# Patient Record
Sex: Female | Born: 1960 | Race: White | Hispanic: No | Marital: Single
Health system: Southern US, Community
[De-identification: ages and names within clinical notes are randomized; demographics above are authoritative.]

---

## 2013-06-21 LAB — COMPREHENSIVE METABOLIC PANEL
BUN: 13 mg/dL (ref 7–18)
Bilirubin,Total: 0.4 mg/dL (ref 0.2–1.0)
Calcium, Total: 8.6 mg/dL (ref 8.5–10.1)
Co2: 28 mmol/L (ref 21–32)
Glucose: 108 mg/dL — ABNORMAL HIGH (ref 65–99)
Osmolality: 276 (ref 275–301)
Potassium: 3.9 mmol/L (ref 3.5–5.1)
SGOT(AST): 37 U/L (ref 15–37)
SGPT (ALT): 29 U/L (ref 12–78)
Sodium: 138 mmol/L (ref 136–145)
Total Protein: 7.1 g/dL (ref 6.4–8.2)

## 2013-06-21 LAB — CBC
MCH: 28.8 pg (ref 26.0–34.0)
MCHC: 33.8 g/dL (ref 32.0–36.0)
RBC: 4.1 10*6/uL (ref 3.80–5.20)
RDW: 14.6 % — ABNORMAL HIGH (ref 11.5–14.5)

## 2013-06-21 LAB — CK TOTAL AND CKMB (NOT AT ARMC)
CK, Total: 113 U/L (ref 21–215)
CK-MB: <0.5 ng/mL — ABNORMAL LOW (ref 0.5–3.6)

## 2013-06-23 ENCOUNTER — Inpatient Hospital Stay: Payer: Self-pay | Admitting: Family Medicine

## 2013-06-25 LAB — CBC WITH DIFFERENTIAL/PLATELET
Basophil %: 0.1 %
Eosinophil #: 0 10*3/uL (ref 0.0–0.7)
Eosinophil %: 0.1 %
HGB: 10.9 g/dL — ABNORMAL LOW (ref 12.0–16.0)
Lymphocyte #: 0.7 10*3/uL — ABNORMAL LOW (ref 1.0–3.6)
Lymphocyte %: 8.6 %
MCH: 28.7 pg (ref 26.0–34.0)
MCHC: 33.8 g/dL (ref 32.0–36.0)
Monocyte #: 0.3 x10 3/mm (ref 0.2–0.9)
Monocyte %: 4.1 %
Neutrophil #: 6.7 10*3/uL — ABNORMAL HIGH (ref 1.4–6.5)
Neutrophil %: 87.1 %
Platelet: 149 10*3/uL — ABNORMAL LOW (ref 150–440)
RBC: 3.79 10*6/uL — ABNORMAL LOW (ref 3.80–5.20)
RDW: 14.9 % — ABNORMAL HIGH (ref 11.5–14.5)
WBC: 7.7 10*3/uL (ref 3.6–11.0)

## 2013-06-25 LAB — BASIC METABOLIC PANEL
Anion Gap: 5 — ABNORMAL LOW (ref 7–16)
BUN: 16 mg/dL (ref 7–18)
Chloride: 104 mmol/L (ref 98–107)
Co2: 32 mmol/L (ref 21–32)
Creatinine: 0.99 mg/dL (ref 0.60–1.30)
EGFR (African American): >60
Glucose: 135 mg/dL — ABNORMAL HIGH (ref 65–99)
Sodium: 141 mmol/L (ref 136–145)

## 2013-06-26 LAB — CULTURE, BLOOD (SINGLE)

## 2013-08-27 LAB — BASIC METABOLIC PANEL
Anion Gap: 5 — ABNORMAL LOW (ref 7–16)
Calcium, Total: 9.1 mg/dL (ref 8.5–10.1)
Creatinine: 0.95 mg/dL (ref 0.60–1.30)
EGFR (African American): >60
EGFR (Non-African Amer.): >60
Glucose: 111 mg/dL — ABNORMAL HIGH (ref 65–99)
Osmolality: 273 (ref 275–301)
Potassium: 3.8 mmol/L (ref 3.5–5.1)
Sodium: 136 mmol/L (ref 136–145)

## 2013-08-27 LAB — CBC
MCH: 28.7 pg (ref 26.0–34.0)
MCHC: 33.6 g/dL (ref 32.0–36.0)
MCV: 85 fL (ref 80–100)
Platelet: 147 10*3/uL — ABNORMAL LOW (ref 150–440)
WBC: 8.1 10*3/uL (ref 3.6–11.0)

## 2013-08-27 LAB — TROPONIN I: Troponin-I: <0.02 ng/mL

## 2013-08-28 ENCOUNTER — Inpatient Hospital Stay: Payer: Self-pay | Admitting: Internal Medicine

## 2013-08-28 LAB — RAPID INFLUENZA A&B ANTIGENS

## 2013-08-29 LAB — BASIC METABOLIC PANEL
Chloride: 108 mmol/L — ABNORMAL HIGH (ref 98–107)
Co2: 26 mmol/L (ref 21–32)
EGFR (African American): >60
Glucose: 165 mg/dL — ABNORMAL HIGH (ref 65–99)

## 2013-08-29 LAB — MAGNESIUM: Magnesium: 1.7 mg/dL — ABNORMAL LOW

## 2013-08-30 LAB — EXPECTORATED SPUTUM ASSESSMENT W REFEX TO RESP CULTURE

## 2013-08-31 LAB — PRO B NATRIURETIC PEPTIDE: B-Type Natriuretic Peptide: 1494 pg/mL — ABNORMAL HIGH (ref 0–125)

## 2013-08-31 LAB — SEDIMENTATION RATE: Erythrocyte Sed Rate: 43 mm/hr — ABNORMAL HIGH (ref 0–30)

## 2013-09-04 LAB — BRONCHIAL WASH CULTURE

## 2013-12-22 ENCOUNTER — Emergency Department: Payer: Self-pay | Admitting: Emergency Medicine

## 2013-12-22 LAB — CBC
HCT: 41.1 % (ref 35.0–47.0)
HGB: 12.9 g/dL (ref 12.0–16.0)
MCH: 27.6 pg (ref 26.0–34.0)
MCHC: 31.3 g/dL — AB (ref 32.0–36.0)
MCV: 88 fL (ref 80–100)
PLATELETS: 193 10*3/uL (ref 150–440)
RBC: 4.66 10*6/uL (ref 3.80–5.20)
RDW: 14.1 % (ref 11.5–14.5)
WBC: 10.3 10*3/uL (ref 3.6–11.0)

## 2013-12-22 LAB — BASIC METABOLIC PANEL
Anion Gap: 5 — ABNORMAL LOW (ref 7–16)
BUN: 13 mg/dL (ref 7–18)
CALCIUM: 9 mg/dL (ref 8.5–10.1)
CO2: 29 mmol/L (ref 21–32)
Chloride: 101 mmol/L (ref 98–107)
Creatinine: 1.01 mg/dL (ref 0.60–1.30)
EGFR (African American): >60
EGFR (Non-African Amer.): >60
Glucose: 94 mg/dL (ref 65–99)
Osmolality: 270 (ref 275–301)
Potassium: 3.9 mmol/L (ref 3.5–5.1)
Sodium: 135 mmol/L — ABNORMAL LOW (ref 136–145)

## 2013-12-22 LAB — TROPONIN I: Troponin-I: <0.02 ng/mL

## 2013-12-22 LAB — CK TOTAL AND CKMB (NOT AT ARMC)
CK, Total: 75 U/L
CK-MB: <0.5 ng/mL — ABNORMAL LOW (ref 0.5–3.6)

## 2013-12-22 LAB — PRO B NATRIURETIC PEPTIDE: B-TYPE NATIURETIC PEPTID: 37 pg/mL (ref 0–125)

## 2013-12-27 LAB — CULTURE, BLOOD (SINGLE)

## 2014-02-12 ENCOUNTER — Ambulatory Visit: Payer: Self-pay | Admitting: Specialist

## 2014-02-14 LAB — CBC WITH DIFFERENTIAL/PLATELET
Basophil #: 0 10*3/uL (ref 0.0–0.1)
Basophil %: 0.4 %
Eosinophil #: 0.2 10*3/uL (ref 0.0–0.7)
Eosinophil %: 2 %
HCT: 36.7 % (ref 35.0–47.0)
HGB: 11.8 g/dL — ABNORMAL LOW (ref 12.0–16.0)
Lymphocyte #: 2.6 10*3/uL (ref 1.0–3.6)
Lymphocyte %: 34.4 %
MCH: 27.8 pg (ref 26.0–34.0)
MCHC: 32.1 g/dL (ref 32.0–36.0)
MCV: 87 fL (ref 80–100)
Monocyte #: 0.6 x10 3/mm (ref 0.2–0.9)
Monocyte %: 8.6 %
Neutrophil #: 4.1 10*3/uL (ref 1.4–6.5)
Neutrophil %: 54.6 %
Platelet: 142 10*3/uL — ABNORMAL LOW (ref 150–440)
RBC: 4.23 10*6/uL (ref 3.80–5.20)
RDW: 14.8 % — ABNORMAL HIGH (ref 11.5–14.5)
WBC: 7.5 10*3/uL (ref 3.6–11.0)

## 2014-02-14 LAB — COMPREHENSIVE METABOLIC PANEL
ALBUMIN: 3.8 g/dL (ref 3.4–5.0)
ANION GAP: 7 (ref 7–16)
Alkaline Phosphatase: 68 U/L
BUN: 7 mg/dL (ref 7–18)
Bilirubin,Total: 0.4 mg/dL (ref 0.2–1.0)
CALCIUM: 8.9 mg/dL (ref 8.5–10.1)
CREATININE: 0.92 mg/dL (ref 0.60–1.30)
Chloride: 102 mmol/L (ref 98–107)
Co2: 28 mmol/L (ref 21–32)
EGFR (African American): >60
EGFR (Non-African Amer.): >60
Glucose: 106 mg/dL — ABNORMAL HIGH (ref 65–99)
OSMOLALITY: 272 (ref 275–301)
Potassium: 3.7 mmol/L (ref 3.5–5.1)
SGOT(AST): 31 U/L (ref 15–37)
SGPT (ALT): 23 U/L (ref 12–78)
Sodium: 137 mmol/L (ref 136–145)
TOTAL PROTEIN: 7.5 g/dL (ref 6.4–8.2)

## 2014-02-14 LAB — CK TOTAL AND CKMB (NOT AT ARMC)
CK, Total: 209 U/L — ABNORMAL HIGH
CK-MB: 0.8 ng/mL (ref 0.5–3.6)

## 2014-02-14 LAB — TROPONIN I: Troponin-I: <0.02 ng/mL

## 2014-02-15 ENCOUNTER — Observation Stay: Payer: Self-pay | Admitting: Family Medicine

## 2014-02-15 LAB — URINALYSIS, COMPLETE
Bacteria: NONE SEEN
Bilirubin,UR: NEGATIVE
Glucose,UR: NEGATIVE mg/dL (ref 0–75)
Ketone: NEGATIVE
Leukocyte Esterase: NEGATIVE
NITRITE: NEGATIVE
Ph: 5 (ref 4.5–8.0)
Protein: NEGATIVE
RBC,UR: 1 /HPF (ref 0–5)
Specific Gravity: 1.004 (ref 1.003–1.030)
Squamous Epithelial: NONE SEEN
WBC UR: <1 /HPF (ref 0–5)

## 2014-02-16 LAB — BASIC METABOLIC PANEL
ANION GAP: 5 — AB (ref 7–16)
BUN: 11 mg/dL (ref 7–18)
Calcium, Total: 9.4 mg/dL (ref 8.5–10.1)
Chloride: 103 mmol/L (ref 98–107)
Co2: 29 mmol/L (ref 21–32)
Creatinine: 0.86 mg/dL (ref 0.60–1.30)
EGFR (African American): >60
EGFR (Non-African Amer.): >60
GLUCOSE: 114 mg/dL — AB (ref 65–99)
OSMOLALITY: 274 (ref 275–301)
Potassium: 4.4 mmol/L (ref 3.5–5.1)
Sodium: 137 mmol/L (ref 136–145)

## 2014-03-04 ENCOUNTER — Emergency Department: Payer: Self-pay | Admitting: Emergency Medicine

## 2014-03-04 LAB — CBC
HCT: 37.9 % (ref 35.0–47.0)
HGB: 12 g/dL (ref 12.0–16.0)
MCH: 27.9 pg (ref 26.0–34.0)
MCHC: 31.7 g/dL — ABNORMAL LOW (ref 32.0–36.0)
MCV: 88 fL (ref 80–100)
Platelet: 183 10*3/uL (ref 150–440)
RBC: 4.31 10*6/uL (ref 3.80–5.20)
RDW: 14.9 % — ABNORMAL HIGH (ref 11.5–14.5)
WBC: 9.2 10*3/uL (ref 3.6–11.0)

## 2014-03-04 LAB — COMPREHENSIVE METABOLIC PANEL
Albumin: 3.6 g/dL (ref 3.4–5.0)
Alkaline Phosphatase: 60 U/L
Anion Gap: 11 (ref 7–16)
BILIRUBIN TOTAL: 0.5 mg/dL (ref 0.2–1.0)
BUN: 8 mg/dL (ref 7–18)
CHLORIDE: 98 mmol/L (ref 98–107)
Calcium, Total: 9.4 mg/dL (ref 8.5–10.1)
Co2: 29 mmol/L (ref 21–32)
Creatinine: 1 mg/dL (ref 0.60–1.30)
EGFR (Non-African Amer.): >60
Glucose: 105 mg/dL — ABNORMAL HIGH (ref 65–99)
OSMOLALITY: 274 (ref 275–301)
Potassium: 4 mmol/L (ref 3.5–5.1)
SGOT(AST): 28 U/L (ref 15–37)
SGPT (ALT): 30 U/L (ref 12–78)
Sodium: 138 mmol/L (ref 136–145)
Total Protein: 8.2 g/dL (ref 6.4–8.2)

## 2014-03-04 LAB — URINALYSIS, COMPLETE
BILIRUBIN, UR: NEGATIVE
Bacteria: NONE SEEN
Glucose,UR: NEGATIVE mg/dL (ref 0–75)
Hyaline Cast: 15
KETONE: NEGATIVE
Leukocyte Esterase: NEGATIVE
Nitrite: NEGATIVE
PROTEIN: NEGATIVE
Ph: 5 (ref 4.5–8.0)
RBC,UR: 3 /HPF (ref 0–5)
SQUAMOUS EPITHELIAL: NONE SEEN
Specific Gravity: 1.018 (ref 1.003–1.030)
WBC UR: 1 /HPF (ref 0–5)

## 2014-03-04 LAB — CK TOTAL AND CKMB (NOT AT ARMC): CK, Total: 124 U/L

## 2014-03-04 LAB — TROPONIN I: Troponin-I: <0.02 ng/mL

## 2014-03-08 ENCOUNTER — Emergency Department: Payer: Self-pay | Admitting: Emergency Medicine

## 2014-03-08 LAB — CBC
HCT: 32 % — AB (ref 35.0–47.0)
HGB: 10.3 g/dL — AB (ref 12.0–16.0)
MCH: 28.1 pg (ref 26.0–34.0)
MCHC: 32.4 g/dL (ref 32.0–36.0)
MCV: 87 fL (ref 80–100)
Platelet: 182 10*3/uL (ref 150–440)
RBC: 3.68 10*6/uL — ABNORMAL LOW (ref 3.80–5.20)
RDW: 14.7 % — AB (ref 11.5–14.5)
WBC: 5.4 10*3/uL (ref 3.6–11.0)

## 2014-03-08 LAB — BASIC METABOLIC PANEL
Anion Gap: 4 — ABNORMAL LOW (ref 7–16)
BUN: 12 mg/dL (ref 7–18)
CALCIUM: 9.3 mg/dL (ref 8.5–10.1)
CHLORIDE: 99 mmol/L (ref 98–107)
Co2: 35 mmol/L — ABNORMAL HIGH (ref 21–32)
Creatinine: 0.8 mg/dL (ref 0.60–1.30)
EGFR (Non-African Amer.): >60
Glucose: 91 mg/dL (ref 65–99)
OSMOLALITY: 275 (ref 275–301)
Potassium: 3.8 mmol/L (ref 3.5–5.1)
SODIUM: 138 mmol/L (ref 136–145)

## 2014-03-08 LAB — TROPONIN I

## 2014-03-09 LAB — CULTURE, BLOOD (SINGLE)

## 2014-03-16 ENCOUNTER — Ambulatory Visit: Payer: Self-pay | Admitting: Emergency Medicine

## 2014-03-16 LAB — CBC WITH DIFFERENTIAL/PLATELET
Basophil #: 0 10*3/uL (ref 0.0–0.1)
Basophil %: 0.4 %
Eosinophil #: 0.2 10*3/uL (ref 0.0–0.7)
Eosinophil %: 3.9 %
HCT: 31.8 % — ABNORMAL LOW (ref 35.0–47.0)
HGB: 10.4 g/dL — ABNORMAL LOW (ref 12.0–16.0)
Lymphocyte #: 1.1 10*3/uL (ref 1.0–3.6)
Lymphocyte %: 17.4 %
MCH: 28.1 pg (ref 26.0–34.0)
MCHC: 32.6 g/dL (ref 32.0–36.0)
MCV: 86 fL (ref 80–100)
Monocyte #: 0.5 x10 3/mm (ref 0.2–0.9)
Monocyte %: 7.9 %
Neutrophil #: 4.4 10*3/uL (ref 1.4–6.5)
Neutrophil %: 70.4 %
Platelet: 220 10*3/uL (ref 150–440)
RBC: 3.69 10*6/uL — ABNORMAL LOW (ref 3.80–5.20)
RDW: 15 % — ABNORMAL HIGH (ref 11.5–14.5)
WBC: 6.3 10*3/uL (ref 3.6–11.0)

## 2014-04-15 ENCOUNTER — Inpatient Hospital Stay: Payer: Self-pay | Admitting: Internal Medicine

## 2014-04-15 LAB — CBC WITH DIFFERENTIAL/PLATELET
Basophil #: 0.1 10*3/uL (ref 0.0–0.1)
Basophil %: 1.2 %
Eosinophil #: 0.4 10*3/uL (ref 0.0–0.7)
Eosinophil %: 8.3 %
HCT: 33.1 % — ABNORMAL LOW (ref 35.0–47.0)
HGB: 10.8 g/dL — ABNORMAL LOW (ref 12.0–16.0)
Lymphocyte #: 1.1 10*3/uL (ref 1.0–3.6)
Lymphocyte %: 20.8 %
MCH: 28.9 pg (ref 26.0–34.0)
MCHC: 32.5 g/dL (ref 32.0–36.0)
MCV: 89 fL (ref 80–100)
Monocyte #: 0.5 x10 3/mm (ref 0.2–0.9)
Monocyte %: 8.8 %
Neutrophil #: 3.2 10*3/uL (ref 1.4–6.5)
Neutrophil %: 60.9 %
Platelet: 191 10*3/uL (ref 150–440)
RBC: 3.73 10*6/uL — ABNORMAL LOW (ref 3.80–5.20)
RDW: 15.5 % — ABNORMAL HIGH (ref 11.5–14.5)
WBC: 5.2 10*3/uL (ref 3.6–11.0)

## 2014-04-15 LAB — PRO B NATRIURETIC PEPTIDE: B-Type Natriuretic Peptide: 49 pg/mL (ref 0–125)

## 2014-04-15 LAB — COMPREHENSIVE METABOLIC PANEL
ANION GAP: 9 (ref 7–16)
Albumin: 3.1 g/dL — ABNORMAL LOW (ref 3.4–5.0)
Alkaline Phosphatase: 55 U/L
BUN: 8 mg/dL (ref 7–18)
Bilirubin,Total: 0.3 mg/dL (ref 0.2–1.0)
Calcium, Total: 8.9 mg/dL (ref 8.5–10.1)
Chloride: 101 mmol/L (ref 98–107)
Co2: 28 mmol/L (ref 21–32)
Creatinine: 1.24 mg/dL (ref 0.60–1.30)
EGFR (African American): 57 — ABNORMAL LOW
EGFR (Non-African Amer.): 49 — ABNORMAL LOW
Glucose: 120 mg/dL — ABNORMAL HIGH (ref 65–99)
OSMOLALITY: 275 (ref 275–301)
Potassium: 3.9 mmol/L (ref 3.5–5.1)
SGOT(AST): 31 U/L (ref 15–37)
SGPT (ALT): 21 U/L (ref 12–78)
SODIUM: 138 mmol/L (ref 136–145)
TOTAL PROTEIN: 7.7 g/dL (ref 6.4–8.2)

## 2014-04-15 LAB — TROPONIN I: Troponin-I: <0.02 ng/mL

## 2014-04-15 LAB — TSH: Thyroid Stimulating Horm: 2.5 u[IU]/mL

## 2014-04-16 LAB — BASIC METABOLIC PANEL
ANION GAP: 8 (ref 7–16)
BUN: 9 mg/dL (ref 7–18)
CALCIUM: 8.4 mg/dL — AB (ref 8.5–10.1)
Chloride: 99 mmol/L (ref 98–107)
Co2: 28 mmol/L (ref 21–32)
Creatinine: 1.14 mg/dL (ref 0.60–1.30)
GFR CALC NON AF AMER: 54 — AB
GLUCOSE: 142 mg/dL — AB (ref 65–99)
OSMOLALITY: 271 (ref 275–301)
POTASSIUM: 4.1 mmol/L (ref 3.5–5.1)
SODIUM: 135 mmol/L — AB (ref 136–145)

## 2014-04-16 LAB — URINALYSIS, COMPLETE
BILIRUBIN, UR: NEGATIVE
Bacteria: NONE SEEN
Glucose,UR: NEGATIVE mg/dL (ref 0–75)
Ketone: NEGATIVE
Leukocyte Esterase: NEGATIVE
NITRITE: NEGATIVE
Ph: 5 (ref 4.5–8.0)
Protein: NEGATIVE
RBC,UR: 11 /HPF (ref 0–5)
Specific Gravity: 1.016 (ref 1.003–1.030)
Squamous Epithelial: <1
WBC UR: 1 /HPF (ref 0–5)

## 2014-04-16 LAB — CBC WITH DIFFERENTIAL/PLATELET
BASOS ABS: 0 10*3/uL (ref 0.0–0.1)
Basophil %: 0.4 %
Eosinophil #: 0.1 10*3/uL (ref 0.0–0.7)
Eosinophil %: 2.7 %
HCT: 29.6 % — AB (ref 35.0–47.0)
HGB: 9.6 g/dL — ABNORMAL LOW (ref 12.0–16.0)
LYMPHS ABS: 0.7 10*3/uL — AB (ref 1.0–3.6)
Lymphocyte %: 17.9 %
MCH: 28.7 pg (ref 26.0–34.0)
MCHC: 32.5 g/dL (ref 32.0–36.0)
MCV: 88 fL (ref 80–100)
MONOS PCT: 4.7 %
Monocyte #: 0.2 x10 3/mm (ref 0.2–0.9)
Neutrophil #: 2.9 10*3/uL (ref 1.4–6.5)
Neutrophil %: 74.3 %
Platelet: 165 10*3/uL (ref 150–440)
RBC: 3.36 10*6/uL — AB (ref 3.80–5.20)
RDW: 15.4 % — AB (ref 11.5–14.5)
WBC: 3.9 10*3/uL (ref 3.6–11.0)

## 2014-04-16 LAB — CK-MB
CK-MB: 0.7 ng/mL (ref 0.5–3.6)
CK-MB: <0.5 ng/mL — ABNORMAL LOW (ref 0.5–3.6)
CK-MB: <0.5 ng/mL — ABNORMAL LOW (ref 0.5–3.6)

## 2014-04-16 LAB — TROPONIN I
Troponin-I: <0.02 ng/mL
Troponin-I: <0.02 ng/mL

## 2014-04-16 LAB — MAGNESIUM: Magnesium: 1.9 mg/dL

## 2014-04-17 LAB — BASIC METABOLIC PANEL
Anion Gap: 7 (ref 7–16)
BUN: 9 mg/dL (ref 7–18)
CALCIUM: 8.8 mg/dL (ref 8.5–10.1)
CO2: 30 mmol/L (ref 21–32)
CREATININE: 0.85 mg/dL (ref 0.60–1.30)
Chloride: 102 mmol/L (ref 98–107)
EGFR (African American): >60
Glucose: 173 mg/dL — ABNORMAL HIGH (ref 65–99)
Osmolality: 280 (ref 275–301)
Potassium: 4.2 mmol/L (ref 3.5–5.1)
Sodium: 139 mmol/L (ref 136–145)

## 2014-04-18 LAB — BASIC METABOLIC PANEL
ANION GAP: 4 — AB (ref 7–16)
BUN: 11 mg/dL (ref 7–18)
CALCIUM: 9 mg/dL (ref 8.5–10.1)
CHLORIDE: 101 mmol/L (ref 98–107)
CREATININE: 1.01 mg/dL (ref 0.60–1.30)
Co2: 35 mmol/L — ABNORMAL HIGH (ref 21–32)
EGFR (African American): >60
EGFR (Non-African Amer.): >60
Glucose: 132 mg/dL — ABNORMAL HIGH (ref 65–99)
Osmolality: 281 (ref 275–301)
Potassium: 4.1 mmol/L (ref 3.5–5.1)
Sodium: 140 mmol/L (ref 136–145)

## 2014-04-18 LAB — CBC WITH DIFFERENTIAL/PLATELET
BASOS ABS: 0 10*3/uL (ref 0.0–0.1)
Basophil %: 0.1 %
Eosinophil #: 0 10*3/uL (ref 0.0–0.7)
Eosinophil %: 0.1 %
HCT: 30.1 % — ABNORMAL LOW (ref 35.0–47.0)
HGB: 9.5 g/dL — AB (ref 12.0–16.0)
LYMPHS ABS: 0.5 10*3/uL — AB (ref 1.0–3.6)
LYMPHS PCT: 8 %
MCH: 27.6 pg (ref 26.0–34.0)
MCHC: 31.6 g/dL — ABNORMAL LOW (ref 32.0–36.0)
MCV: 88 fL (ref 80–100)
Monocyte #: 0.3 x10 3/mm (ref 0.2–0.9)
Monocyte %: 4.2 %
NEUTROS PCT: 87.6 %
Neutrophil #: 5.7 10*3/uL (ref 1.4–6.5)
Platelet: 201 10*3/uL (ref 150–440)
RBC: 3.44 10*6/uL — AB (ref 3.80–5.20)
RDW: 15.6 % — ABNORMAL HIGH (ref 11.5–14.5)
WBC: 6.5 10*3/uL (ref 3.6–11.0)

## 2014-04-20 LAB — CULTURE, BLOOD (SINGLE)

## 2014-04-21 LAB — COMPREHENSIVE METABOLIC PANEL
ALT: 20 U/L (ref 12–78)
ANION GAP: 4 — AB (ref 7–16)
Albumin: 2.6 g/dL — ABNORMAL LOW (ref 3.4–5.0)
Alkaline Phosphatase: 39 U/L — ABNORMAL LOW
BILIRUBIN TOTAL: 0.2 mg/dL (ref 0.2–1.0)
BUN: 23 mg/dL — AB (ref 7–18)
CHLORIDE: 96 mmol/L — AB (ref 98–107)
CREATININE: 0.98 mg/dL (ref 0.60–1.30)
Calcium, Total: 8.7 mg/dL (ref 8.5–10.1)
Co2: 37 mmol/L — ABNORMAL HIGH (ref 21–32)
EGFR (African American): >60
EGFR (Non-African Amer.): >60
GLUCOSE: 150 mg/dL — AB (ref 65–99)
OSMOLALITY: 280 (ref 275–301)
POTASSIUM: 3.9 mmol/L (ref 3.5–5.1)
SGOT(AST): 18 U/L (ref 15–37)
SODIUM: 137 mmol/L (ref 136–145)
Total Protein: 6.6 g/dL (ref 6.4–8.2)

## 2014-04-21 LAB — PLATELET COUNT: Platelet: 212 10*3/uL (ref 150–440)

## 2014-05-10 LAB — EXPECTORATED SPUTUM ASSESSMENT W GRAM STAIN, RFLX TO RESP C

## 2014-08-16 ENCOUNTER — Emergency Department: Payer: Self-pay | Admitting: Emergency Medicine

## 2014-08-16 LAB — CBC
HCT: 32.7 % — ABNORMAL LOW (ref 35.0–47.0)
HGB: 10.7 g/dL — ABNORMAL LOW (ref 12.0–16.0)
MCH: 27.4 pg (ref 26.0–34.0)
MCHC: 32.7 g/dL (ref 32.0–36.0)
MCV: 84 fL (ref 80–100)
Platelet: 177 10*3/uL (ref 150–440)
RBC: 3.9 10*6/uL (ref 3.80–5.20)
RDW: 14.8 % — ABNORMAL HIGH (ref 11.5–14.5)
WBC: 6.7 10*3/uL (ref 3.6–11.0)

## 2014-08-16 LAB — COMPREHENSIVE METABOLIC PANEL
ALBUMIN: 3.1 g/dL — AB (ref 3.4–5.0)
AST: 33 U/L (ref 15–37)
Alkaline Phosphatase: 54 U/L
Anion Gap: 9 (ref 7–16)
BUN: 8 mg/dL (ref 7–18)
Bilirubin,Total: 0.3 mg/dL (ref 0.2–1.0)
CO2: 30 mmol/L (ref 21–32)
Calcium, Total: 8.2 mg/dL — ABNORMAL LOW (ref 8.5–10.1)
Chloride: 101 mmol/L (ref 98–107)
Creatinine: 1.04 mg/dL (ref 0.60–1.30)
EGFR (African American): >60
GFR CALC NON AF AMER: 59 — AB
Glucose: 156 mg/dL — ABNORMAL HIGH (ref 65–99)
POTASSIUM: 3.7 mmol/L (ref 3.5–5.1)
SGPT (ALT): 19 U/L
SODIUM: 140 mmol/L (ref 136–145)
TOTAL PROTEIN: 7.3 g/dL (ref 6.4–8.2)

## 2014-08-16 LAB — CK TOTAL AND CKMB (NOT AT ARMC): CK, Total: 92 U/L

## 2014-08-16 LAB — TROPONIN I: Troponin-I: <0.02 ng/mL

## 2015-01-28 NOTE — H&P (Signed)
PATIENT NAME:  Kristen Cantrell, Kristen Cantrell MR#:  [id number] DATE OF BIRTH:  1960-12-31  DATE OF ADMISSION:  06/22/2013  REFERRING PHYSICIAN: Dr. Minna Antis.   PRIMARY CARE PHYSICIAN: [facility].   CHIEF COMPLAINT: Shortness of breath and fever.   HISTORY OF PRESENT ILLNESS: This is a 54 year old female with history of tobacco abuse, hyperlipidemia, fibromyalgia, GERD and questionable diagnosis of COPD, presents with complaints of shortness of breath. The patient reports her symptoms have been going on for the last 3 to 4 weeks. Recently seen by her PCP where she was given p.o. prednisone without much relief. Still complains of cough productive with fever. The patient was febrile in the ED at 103.7. The patient received 3 DuoNebs with minimal improvement of her shortness of breath. The patient was 91% saturating on ambulation. The patient's chest x-ray did show bilateral haziness, possible atypical pneumonia versus interstitial lung disease. There is no old chest x ray to compare. Hospitalist service was requested to admit the patient for management and treatment of her pneumonia. The patient had blood cultures sent in the ED. Started on azithromycin and Levaquin in the ED for possible atypical pneumonia.   PAST MEDICAL HISTORY:  1. Fibromyalgia.  2. Hyperlipidemia.  3. Irritable bowel syndrome.  4. Esophageal reflux disease.   5. Questionable history of COPD.    SURGICAL HISTORY: Left ear surgery.   SOCIAL HISTORY: The patient smokes 1/2 to 1 pack per day. No illicit drug use. No alcohol use. She is on disability secondary to lower back pain.   FAMILY HISTORY: Denies any history of coronary artery disease at young age.   ALLERGIES:  1. CODEINE.  2. ERYTHROMYCIN.  3. PENICILLIN.  4. SULFA DRUGS.   HOME MEDICATIONS:  1. Diazepam 2.5 mg p.o. every 8 hours as needed for vertigo.  2. Lisinopril 20 mg oral daily.  3. Zocor 40 mg at bedtime.  4. ProAir inhalational as needed.  5. Omeprazole  40 mg oral daily.   REVIEW OF SYSTEMS:  CONSTITUTIONAL: Complains of fever, fatigue, weakness. Denies weight gain, weight loss.  EYES: Denies blurry vision, double vision, inflammation, glaucoma.  ENT: Denies tinnitus, ear pain, hearing loss, epistaxis or discharge.  RESPIRATORY: Complains of cough and shortness of breath. Denies hemoptysis. Denies wheezing.  CARDIOVASCULAR: Denies chest pain, edema, arrhythmia or palpitation.  GASTROINTESTINAL: Denies nausea, vomiting, diarrhea, abdominal pain, hematemesis.  GENITOURINARY: Denies dysuria, hematuria, renal colic.  ENDOCRINE: Denies polyuria, polydipsia, heat or cold intolerance.  HEMATOLOGY: Denies anemia, easy bruising, bleeding diathesis.   INTEGUMENTARY: Denies acne, rash or skin lesions.  MUSCULOSKELETAL: Denies any gout, arthritis or cramps.  NEUROLOGIC: Denies CVA, TIA, dementia, ataxia.  PSYCHIATRIC: Denies insomnia, schizophrenia. Denies substance or alcohol abuse. Has history of depression.   PHYSICAL EXAMINATION:  VITAL SIGNS: T-max 103.1, currently 99.1, pulse 72, respiratory rate 28, blood pressure 102/63, saturating 91% on room air.  GENERAL: Well-nourished female, looks comfortable, in no apparent distress.  HEENT: Head is atraumatic, normocephalic. Pupils equal, reactive to light. Pink conjunctivae. Anicteric sclerae. Moist oral mucosa.  NECK: Supple. No thyromegaly. No JVD.  CHEST: Good air entry bilaterally. No wheezing but has diffuse rales and rhonchi bibasilarly up to the mid lung fields.  CARDIOVASCULAR: S1, S2 heard. No rubs, murmurs, gallops.  ABDOMEN: Soft, nontender, nondistended. Bowel sounds present.  EXTREMITIES: No edema. No clubbing. No cyanosis.  PSYCHIATRIC: Appropriate affect. Awake, alert x3. Intact judgment and insight.  NEUROLOGIC: Cranial nerves grossly intact. Motor 5 out of 5. No focal deficits.  LYMPHATICS: No cervical or supraclavicular lymphadenopathy.  SKIN: Warm and dry. Normal skin turgor.   MUSCULOSKELETAL: No edema. No clubbing. No cyanosis.   PERTINENT LABORATORIES: Glucose 108, BUN 13, creatinine 1.09, sodium 138, potassium 3.9, chloride 106, CO2 28. White blood cells 6.3, hemoglobin 11.8, hematocrit 34.9, platelets 119.   Chest x-ray, preliminary reading, showing bilateral haziness, unclear if this is interstitial lung disease or opacity. There is no old chest x-ray to compare.   ASSESSMENT AND PLAN:  1. Pneumonia: The patient appears to be having atypical pneumonia versus a viral illness as well. She will be admitted. She will be kept on intravenous levofloxacin and on p.r.n. oxygen. Will continue with her hydration and nebulizer as needed. Hopefully, when she is appropriately hydrated, she can be discharged home on p.o. Levaquin or erythromycin.  2. Tobacco abuse: The patient was counseled. She will be started on NicoDerm patch.  3. Gastroesophageal reflux disease: On proton pump inhibitor.   4. Hyperlipidemia: Continue with statin.  5. Deep vein thrombosis prophylaxis: Subcutaneous heparin.   CODE STATUS: FULL CODE.   TOTAL TIME SPENT ON ADMISSION AND PATIENT CARE: 45 minutes.   ____________________________ Starleen Armsawood S. Elgergawy, MD dse:gb D: 06/22/2013 02:03:03 ET T: 06/22/2013 03:13:06 ET JOB#: [id number]  cc: Starleen Armsawood S. Elgergawy, MD, <Dictator> DAWOOD Teena IraniS ELGERGAWY MD ELECTRONICALLY SIGNED 06/24/2013 2:41

## 2015-01-28 NOTE — Discharge Summary (Signed)
PATIENT NAME:  Kristen Cantrell, Kristen Cantrell MR#:  [id number] DATE OF BIRTH:  [birth year]  DATE OF ADMISSION:  08/28/2013 DATE OF DISCHARGE:  09/01/2013  DISCHARGE DIAGNOSES: 1.  Acute respiratory failure.  2.  Chronic obstructive pulmonary disease exacerbation.  3.  Bilateral pneumonitis.  4.  Mild thrombocytopenia.  5.  Sepsis.  6.  Tobacco abuse.  7.  Anxiety.   IMAGING STUDIES DONE:  1.  Include a CT of the chest, which showed bilateral ground glass opacities.    2.  Echocardiogram showed EF of 70%. No systolic or diastolic dysfunction.  3.  Chest x-ray was normal.   CONSULTS: Dr. Meredeth IdeFleming and Dr. Belia HemanKasa of pulmonology.   PROCEDURE: Bronchoscopy which showed significant erythema in the airways along with mucus. BAL done and sample sent to the lab.   ADMITTING HISTORY AND PHYSICAL: Please see detailed H and P dictated by Dr. Jacques NavyAhmadzia. In brief, a 54 year old female patient with history of tobacco abuse, COPD on home oxygen, who presented to the hospital complaining of shortness of breath and cough and fever. The patient was admitted for COPD exacerbation on antibiotics.   The patient was treated for sepsis with bilateral pneumonitis found on CT scan along with COPD exacerbation with antibiotics, steroids, nebulizers, IV fluids and oxygen support. The patient did improved well and by the day of discharge the patient is saturating greater than 92% on room air. The patient did have extensive work-up for possible hypersensitivity pneumonitis, sarcoidosis or autoimmune diseases including interstitial lung disease. The patient had a bronchoscopy.  A BAL has been sent.  At this time, the patient is afebrile, normal white count. Will be on antibiotics, steroids and nebulizers at home and follow up with Dr. Meredeth IdeFleming of pulmonology who has seen the patient here in the hospital. The patient has requested to be discharged home and mentions that she can follow up with Dr. Meredeth IdeFleming as outpatient.   Prior to discharge, the  patient has mild wheezing, saturating 94% on room air.   The patient was extensively counseled to quit smoking.   DISCHARGE MEDICATIONS: Include:  1.  Valium 5 mg oral once a day at bedtime and 1/2 tablet every 8 hours as needed for vertigo. 2.  Omeprazole 20 mg oral 2 times a day.  3.  Escitalopram 20 mg oral once a day.  4.  Simvastatin 40 mg daily.  5.  ProAir HFA 2 puffs inhaled 4 times a day as needed.  6.  Spiriva 18 mcg  inhaled once a day.  7.  Ferrous sulfate 325 mg oral 2 times a day.  8.  Promethazine 12.5 mg oral once a day as needed for nausea or vomiting.  9.  Advair Diskus 500/50 one puff inhaled 2 times a day.  10.  Acetaminophen hydrocodone 325/5 one tablet oral every 8 hours as needed for cough.  11.  Benzonatate 100 mg oral every 6 hours as needed for cough.  12.  Guaifenesin 600 mg oral 2 times a day.  13.  Nicotine patch 21 mg per 24 hours once a day.  14.  DuoNeb  3 mL inhaled 4 times a day as needed for shortness of breath or wheezing.  15.  Prednisone 60 mg tapered 5 mg oral 12 days.  16.  Cefuroxime 500 mg oral 2 times a day for 5 days.   DISCHARGE INSTRUCTIONS: The patient will be on a regular diet. Activity as tolerated. Follow up with Dr. Meredeth IdeFleming and primary care physician in 1 to 2  weeks.   Time spent on day of discharge in discharge activity was 35 minutes.    ____________________________ Molinda Bailiff Abdoulaye Drum, MD srs:dp D: 09/02/2013 11:28:02 ET T: 09/02/2013 12:42:19 ET JOB#: [id number]  cc: Wardell Heath R. Keonia Pasko, MD, <Dictator> Orie Fisherman MD ELECTRONICALLY SIGNED 09/07/2013 1:42

## 2015-01-28 NOTE — H&P (Signed)
PATIENT NAME:  Kristen Cantrell, Kristen Cantrell MR#:  [id number] DATE OF BIRTH:  Feb 17, 1961  DATE OF ADMISSION:  08/28/2013  PRIMARY CARE PHYSICIAN: At [facility].   REFERRING PHYSICIAN: Dr. Manson Passey.    CHIEF COMPLAINT: Shortness of breath.   HISTORY OF PRESENT ILLNESS: The patient is a 54 year old Caucasian female with history of fibromyalgia, irritable bowel syndrome, history of suspected COPD, atypical pneumonia and pneumonitis who has had a hospitalization here in the middle of September. At that time, she was started on Levaquin, discharged on Levaquin for the atypical pneumonia and interstitial pneumonitis. She stated that she never felt back to baseline. At that time, she was discharged on oxygen. Since then, she has had another round of azithromycin the last month or so. She possibly has seen a pulmonologist as an outpatient as well, but has not felt better. She feels like she is short of breath, has a productive cough most mornings. Has dyspnea on exertion. She started to develop a high-grade fever again today and came into the hospital where she was found with a fever of 102 without any white count. X-ray of the chest was done, PA and lateral, showing chronic bronchitic changes and persistent diffusely increased interstitial marking in both lungs. A CAT scan of the chest with contrast was done for further  visualization showing bilateral lung infiltrate; favor atypical pneumonia. Hospitalist services were contacted for further evaluation and management. Extreme of note, she was started on a dose of Levaquin, but she stated that she started to have a rash with it. She was given some steroids as well. Hospitalist services were contacted for further evaluation and management.   PAST MEDICAL HISTORY: Fibromyalgia, anxiety, vertigo, hyperlipidemia, irritable bowel syndrome, GERD, history of possible COPD,  atypical pneumonia with pneumonitis.  PAST SURGICAL HISTORY:  Left ear surgery.   SOCIAL HISTORY: Still smokes, but she  is trying to quit. No drug use, no alcohol.   FAMILY HISTORY: Denies family history of CAD.    ALLERGIES: ARE MULTIPLE; CODEINE, ERYTHROMYCIN, PENICILLIN, SULFA, POSSIBLY LEVAQUIN. SHE ALSO STATES SHE IS ALLERGIC TO DOXYCYCLINE.    OUTPATIENT MEDICATIONS: Diazepam 2.5 mg p.r.n. for vertigo, Zocor 40 mg at bedtime, ProAir p.r.n., omeprazole 20 mg 2 times a day, promethazine p.r.n., citalopram 20 mg daily, Advair 500/50 mcg inhaled b.i.d., Spiriva 18 mcg inhaled daily.   REVIEW OF SYSTEMS:  CONSTITUTIONAL: Positive fatigue, weakness, shortness of breath and fevers today. No weight changes.  EYES: No blurry vision or double vision.  ENT: Has decreased hearing. Has a hearing implant on the left.  RESPIRATORY: Positive for cough, mostly productive in the mornings, shortness of breath, dyspnea on exertion. No wheezing.  CARDIOVASCULAR: No chest pain, but does have to sleep not lying flat. No arrhythmia or hypertension.  GASTROINTESTINAL: Has been having chronic diarrhea multiple times and some days and not others. Usually is dark;  she is on iron which she attributes to. Denies any  bloody stools, hematemesis or bright red blood per rectum.  GENITOURINARY: Denies dysuria, hematuria.  HEMATOLOGIC/LYMPHATICS: The patient has history of anemia, no easy bruising.  SKIN: No rashes.  MUSCULOSKELETAL: Has chronic arthritis.  NEUROLOGIC: No focal weakness or numbness.  PSYCHIATRIC: Has insomnia. She has depression and anxiety.   PHYSICAL EXAMINATION: VITAL SIGNS: Temperature on arrival 102.2, pulse rate 95, respiratory rate 22, blood pressure was 112/66, O2 sat 95% on room air.  GENERAL: The patient is a Caucasian female lying in bed, talking in somewhat full sentences, not obviously distressed.  HEENT: Normocephalic, atraumatic.  Pupils are equal and reactive. Anicteric sclerae. Extraocular muscles intact.  NECK: Supple. No thyroid tenderness, no cervical lymphadenopathy. No JVD.  CARDIOVASCULAR: S1,  S2, irregularly irregular. No murmurs, rubs or gallops.  LUNGS: Bilateral diffuse crackles both lungs. No significant wheezing.  ABDOMEN: Soft, nontender, nondistended. Positive bowel sounds are hyperactive in all quadrants.   EXTREMITIES: No significant lower extremity edema. NEUROLOGIC:  Cranial nerves II through XII grossly intact. Strength is 5 over 5 in all extremities. Sensation is intact to light touch.  PSYCHIATRIC: Awake, alert and oriented x 3.  SKIN: No obvious rashes.   DATABASE: A CT of the chest as above. X-ray as above. White count within normal limits at 8.1, hemoglobin 12, platelets are 147. Troponin negative. BUN 14, creatinine 0.95, sodium 136, potassium 3.8.   EKG: Normal sinus rhythm, 87 is the rate, with nonspecific ST changes.  No acute ST elevations or depressions.   ASSESSMENT AND PLAN: We have a 54 year old with suspected chronic obstructive pulmonary disease, ongoing smoker, hyperlipidemia, fibromyalgia, chronic pain, chronic respiratory failure on 2 liters of oxygen, who presents with persistent shortness of breath, fevers today. The patient has a systemic inflammatory response syndrome criteria with mild tachycardia and fever. Etiology is likely the lung.  Will get the CAT scan suggestive of atypical pneumonia; I do not know if this is necessarily bacterial or viral at this point. It is also possible this is chronic interstitial lung disease or another form of chronic lung disease, but as she has had multiple rounds of antibiotics and has not improved, I  would hold further antibiotics at this point until the patient is evaluated by pulmonary. I would start her on Advair, Spiriva, oxygen and nebulizers around-the-clock for now and some steroids. It is possible she needs a bronch. It is also possible this is viral, but that I believe is less likely as the patient has had ongoing progressive symptoms for a couple of months by now.  She is not significantly volume overloaded  for this to be pulmonary edema. To me, she has no significant lower extremity edema nor order evidence of congestive heart failure and the fact that she has a fever counts against that. We will obtain  sputum cultures. I have obtained rapid flu for now,  but although it is possible, I believe also that is less likely. Try to treat the fever, the pain and the shortness of breath symptomatically for now, start the patient on Lovenox for deep vein thrombosis prophylaxis and await for further pulmonary input. I would continue the simvastatin as well. I would place her on a regular diet for now. The patient is full code. The patient was counseled about her continued smoking for greater than three minutes. She is motivated to stop. I will start her on nicotine patch.   Total Time Spent: 45 minutes.    ____________________________ Krystal EatonShayiq Stevi Hollinshead, MD sa:NTS D: 08/28/2013 05:05:21 ET T: 08/28/2013 05:24:31 ET JOB#: [id number]  cc: Krystal EatonShayiq Jacalyn Biggs, MD, <Dictator> Krystal EatonSHAYIQ Julio Zappia MD ELECTRONICALLY SIGNED 09/14/2013 20:01

## 2015-01-28 NOTE — Discharge Summary (Signed)
PATIENT NAME:  Kristen Cantrell, Kristen Cantrell MR#:  [id number] DATE OF BIRTH:  06/26/1961  DATE OF ADMISSION:  06/23/2013 DATE OF DISCHARGE:  06/25/2013  REASON FOR ADMISSION: Increased shortness of breath and fever.   DISCHARGE DIAGNOSES:  1. Acute respiratory failure, hypoxic.  2. Acute chronic obstructive pulmonary disease exacerbation.  3. Interstitial pneumonitis.  4. Hyperlipidemia.  5. Anxiety.  6. Vertigo.  7. Depression.  8. Ongoing tobacco abuse.  9. Gastroesophageal reflux disease. 10. Mild anemia, likely due to chronic disease.   DISPOSITION: Home.   MEDICATIONS AT DISCHARGE:  1. Diazepam 5 mg once at night and 1/2-tablet every 8 hours as needed for vertigo.  2. Omeprazole 20 mg twice daily.  3. Escitalopram 20 mg once daily.  4. Simvastatin 40 mg at bedtime. 5. ProAir inhaler 2 puffs 4 times a day. 6. Benzonatate 100 mg every 6 hours. 7. Advair 500/50 one puff twice daily.  8. Spiriva 1 capsule once daily inhaled.  9. Guaifenesin 600 mg twice daily.  10. Levofloxacin 750 mg once a day for another 8 days.  11. Nicotine 21 mg transdermal patch.  12. Oxygen 2 liters nasal cannula continuously.   FOLLOWUP: [facility], follow up in 1 to 2 weeks.   HOSPITAL COURSE: This is a very nice 54 year old female who has history of ongoing tobacco abuse, hyperlipidemia, fibromyalgia, GERD, questionable diagnosis of COPD or at least non-formal diagnosis of COPD. The patient had complaints of shortness of breath that had been going on for 3 to 4 weeks. The patient was seen by her PCP, who prescribed prednisone with not much relief. The patient was starting to have more cough and developed a fever of 103 in the ER. The patient had 91% oxygen saturation on admission, and she was admitted for treatment of pneumonia/pneumonitis with likely to be atypical, and she was started on Levaquin. The patient had also started on oxygen, and we were not able to wean her off by discharge, for  which the patient was discharged on home oxygen temporarily versus indefinitely. The patient had issues with her oxygen saturation all along. She was not able to walk to the bathroom without getting significantly winded, short of breath. The patient was kept on nebulizers, and she was started on Advair and also on Spiriva since she is a smoker. The patient was very symptomatic and having significant or shortness of breath during this hospitalization. By day #4, the patient was feeling better. She was not quite back to her normal self, but she wanted to go home, for which we discharged her on oxygen. She needs to continue her antibiotics with Levaquin, and she was given steroids during this hospitalization, but that made her really jittery, and she refused to have steroids. She was not prescribed any steroids at discharge other than the ones that were inhaled.   LABORATORY DATA: Blood cultures were negative. Her hemoglobin was around 11.8 on admission. Her troponin was negative. Her LFTs were normal. Her glucose was 135, sodium 141, potassium 4.0, creatinine 0.99.   CONDITION ON DISCHARGE: The patient discharged in good condition with multiple recommendations, especially smoking cessation. The patient decided to quit smoking. We prescribed patches for that.   TIME SPENT: I spent about 40 minutes with this discharge on the day of discharge.   ____________________________ Felipa Furnaceoberto Sanchez Gutierrez, MD rsg:OSi D: 06/27/2013 07:11:55 ET T: 06/27/2013 07:45:33 ET JOB#: [id number]  cc: Felipa Furnaceoberto Sanchez Gutierrez, MD, <Dictator> [facility] Nishaan Stanke SANCHEZ GUTIERRE MD  ELECTRONICALLY SIGNED 07/02/2013 10:53

## 2015-01-29 NOTE — H&P (Signed)
PATIENT NAME:  Kristen Cantrell, Kristen Cantrell MR#:  [id number] DATE OF BIRTH:  July 09, 1961  DATE OF ADMISSION:  04/15/2014  REFERRING PHYSICIAN:  Dr. Dorothea Glassman.   PRIMARY PULMONARY:  Dr. Meredeth Ide.   PRIMARY CARE PHYSICIAN:  The patient recently switched to [facility], Dr. Leotis Shames.  Used to follow at [facility], but no more.   CHIEF COMPLAINT:  Cough, fever, shortness of breath.   HISTORY OF PRESENT ILLNESS:  This is a 54 year old female with known history of COPD, chronic respiratory failure on 2 liters nasal cannula at baseline, and biopsy-proven pulmonary fibrosis, the patient presents with complaints of worsening shortness of breath, cough, productive sputum, yellow in color and fever, the patient was febrile 103 in the Emergency Room, had no leukocytosis, her chest x-ray did show evidence of left lung opacity, the patient had blood cultures sent and then started on broad-spectrum IV antibiotics for pneumonia, hospitalist requested to admit the patient for further treatment, the patient as well was complaining of chest pain whenever she coughs, described it as musculoskeletal related to cough in epigastric area, and her troponin was negative.   PAST MEDICAL HISTORY: 1.  COPD.  2.  Pulmonary fibrosis.  3.  Chronic respiratory failure, on 2 liters baseline.  4.  Hyperlipidemia.  5.  Gastroesophageal reflux disease.  6.  Fibromyalgia.   SOCIAL HISTORY:  The patient reports she is still smoking, but reports she does not actually inhale it, for what she just lights the cigarette and does not smoke it.  Denies any alcohol, any illicit drug use.   FAMILY HISTORY:  Significant for lung disease in her father what appeared to be silicosis asbestosis.   ALLERGIES:  CODEINE, DOXYCYCLINE, LEVAQUIN, NEOCIN, PENICILLIN, AND SULFA DRUGS.   REVIEW OF SYSTEMS: CONSTITUTIONAL:  The patient denies weight gain or weight loss.  Reports fever, chills, fatigue, weakness. EYES:  Denies blurry vision, double  vision, inflammation. EARS, NOSE, THROAT:  Denies tinnitus, ear pain, hearing loss, epistaxis.  RESPIRATORY:  Reports cough, shortness of breath, productive sputum, history of COPD and pulmonary fibrosis.  CARDIOVASCULAR:  Reports musculoskeletal chest pain related to cough.  Denies any palpitation, edema, syncope.  GASTROINTESTINAL:  Denies nausea, vomiting, diarrhea, abdominal pain, hematemesis.  GENITOURINARY:  Denies dysuria, hematuria, renal colic.  ENDOCRINE:  Denies polyuria or polydipsia, heat or cold intolerance.  HEMATOLOGY:  Denies anemia, easy bruising, bleeding diathesis.  INTEGUMENT:  Denies acne, rash or skin lesion.  MUSCULOSKELETAL:  Denies any neck pain, back pain, shoulder pain, any gout.  NEUROLOGIC:  Denies any history of CVA, TIA, dementia, any focal deficits, tingling, numbness.  PSYCHIATRIC:  Reports history of depression.  She denies suicidal or homicidal ideation.  Reports a history of anxiety.   HOME MEDICATIONS: 1.  Ibuprofen 400 daily as needed.  2.  Diazepam 5 mg twice daily.  3.  Lexapro 20 mg oral daily.  4.  Phenergan as needed.  5.  Simvastatin 40 mg daily.  6.  Advair Diskus 500/50 1 puff twice daily.  7.  Albuterol as needed.  8.  DuoNebs as needed.  9.  Spiriva 18 mcg inhalational daily.  10.  Ferrous sulfate 325 mg oral daily.  11.  Omeprazole 20 mg oral 2 times a day.   PHYSICAL EXAMINATION: VITAL SIGNS:  Temperature 99.8, pulse 85, respiratory rate 26, blood pressure 99/69, saturating 98% on oxygen, upon presentation T-max was 103.2.   GENERAL:  Frail elderly female lies in bed, in mild respiratory distress.  HEENT:  Head atraumatic, normocephalic.  Pupils equal, reactive to light.  Pink conjunctivae.  Anicteric sclerae.  Moist oral mucosa.  NECK:  Supple.  No thyromegaly.  No JVD.  CHEST:  Good air entry bilaterally with coarse respiratory sounds and scattered wheezing.  No use of accessory muscles.  CARDIOVASCULAR:  S1, S2 heard.  No rubs,  murmurs, or gallops.  ABDOMEN:  Soft, nontender, nondistended.  Bowel sounds present.  EXTREMITIES:  No edema.  No clubbing.  No cyanosis.  Pedal pulses are +2 bilaterally.  PSYCHIATRIC:  Appropriate affect.  Awake, alert x 3.  Intact judgment and insight.  Cranial nerves grossly intact.  Motor five out of five.  No focal deficits.  MUSCULOSKELETAL:  No joint effusion or erythema.   PERTINENT LABORATORY DATA:  Glucose 120, BNP 49, BUN 8, creatinine 1.24, sodium 138, potassium 3.9, chloride 101, CO2 28.  Troponin less than 0.02.  TSH 2.5.  White blood cell 5.2, hemoglobin 10.8, hematocrit 33.1, platelets 191,000.  Chest x-ray showing chronic fibrosis and bronchiectatic changes on the lung, focal developing nodular opacity in the left lung and the left mid lung.   ASSESSMENT AND PLAN: 1.  Pneumonia, the patient presents with fever, chills, cough, productive sputum and evidence of left lung capacity, she will be started on broad-spectrum antibiotics, we will follow on blood cultures and adjust antibiotics as needed, given her multiple allergies we will start the patient on  Azactam and azithromycin.  2.  Chronic obstructive pulmonary disease, the patient had scattered wheezing, we will start her on Solu-Medrol and DuoNebs every four hours and as needed albuterol.  We will continue her on her home Advair and Spiriva overdose.  3.  Chest pain, atypical, musculoskeletal, related to cough.  We will give her one dose of aspirin.  We will continue cycle cardiac enzymes.  We will have her on off unit telemetry.  4.  Hyperlipidemia.  Continue with statin.  5.  Gastroesophageal reflux disease.  Continue with proton pump inhibitor.  6.  History of depression.  Continue with Lexapro.  7.  Anemia, stable at baseline.  Continue with iron supplement.  8.  Deep vein thrombosis prophylaxis.  SubQ heparin.  9.  CODE STATUS:  THE PATIENT REPORTS SHE IS A FULL CODE.   Total time spent on admission and patient care 55  minutes.     ____________________________ Starleen Armsawood S. Laneisha Mino, MD dse:ea D: 04/15/2014 23:51:08 ET T: 04/16/2014 01:21:03 ET JOB#: [id number]  cc: Starleen Armsawood S. Burney Calzadilla, MD, <Dictator> Jeweliana Dudgeon Teena IraniS Shali Vesey MD ELECTRONICALLY SIGNED 04/16/2014 23:29
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
## 2015-01-29 NOTE — Discharge Summary (Signed)
Dates of Admission and Diagnosis:  Date of Admission 15-Apr-2014   Date of Discharge 22-Apr-2014   Admitting Diagnosis COPD exacerbation, pneumonia, pulmonary fibrosis   Final Diagnosis COPD exacerbation, pneumonia, pulmonary fibrosis   Discharge Diagnosis 1 COPD exacerbation, pneumonia, pulmonary fibrosis   2 Hyperlipidemia   3 Anxiety    Chief Complaint/History of Present Illness 54 year old lady with h/o pulmonary fibrosis, COPD and chronic respiratory failure uses O2 n/c 2 L/min at home. She presented with c/o productive cough, fever and shortness of breath. Patient was admitted for COPD exacerbation and pneumonia   Allergies:  Sulfa drugs: Hives  Codeine: Agitation  Levaquin: Hives  Doxycycline: Rash  Penicillin: Hives  Niacin: Other  Erythrocin Lactobionate: Unknown  Percocet 10/325: Unknown    Thyroid:  09-Jul-15 21:04   Thyroid Stimulating Hormone 2.50 (0.45-4.50 (International Unit)  ----------------------- Pregnant patients have  different reference  ranges for TSH:  - - - - - - - - - -  Pregnant, first trimetser:  0.36 - 2.50 uIU/mL)  Hepatic:  09-Jul-15 21:04   Bilirubin, Total 0.3  Alkaline Phosphatase 55 (45-117 NOTE: New Reference Range 08/28/13)  SGPT (ALT) 21  SGOT (AST) 31  Total Protein, Serum 7.7  Albumin, Serum  3.1  15-Jul-15 05:12   Bilirubin, Total 0.2  Alkaline Phosphatase  39 (45-117 NOTE: New Reference Range 08/28/13)  SGPT (ALT) 20  SGOT (AST) 18  Total Protein, Serum 6.6  Albumin, Serum  2.6  Routine Micro:  09-Jul-15 21:04   Micro Text Report BLOOD CULTURE   COMMENT                   NO GROWTH AEROBICALLY/ANAEROBICALLY IN 5 DAYS   ANTIBIOTIC                       Culture Comment NO GROWTH AEROBICALLY/ANAEROBICALLY IN 5 DAYS  Result(s) reported on 20 Apr 2014 at 09:00PM.    21:06   Micro Text Report BLOOD CULTURE   COMMENT                   NO GROWTH AEROBICALLY/ANAEROBICALLY IN 5 DAYS   ANTIBIOTIC                        Culture Comment NO GROWTH AEROBICALLY/ANAEROBICALLY IN 5 DAYS  Result(s) reported on 20 Apr 2014 at 09:00PM.  10-Jul-15 09:37   Organism Name FUNGUS ISOLATED-TO STATE LAB FOR ID  Organism Quantity RARE  Micro Text Report SPUTUM CULTURE   ORGANISM 1                RARE FUNGUS ISOLATED-TO STATE LAB FOR ID   COMMENT                   -   GRAM STAIN                FAIR SPECIMEN-70-80% WBC   GRAM STAIN                MANY WHITE BLOOD CELLS   GRAM STAINMODERATE GRAM POSITIVE COCCI IN PAIRS   GRAM STAIN                FEW GRAM POSITIVE RODS RARE GRAM VARIABLE ROD   ANTIBIOTIC                       Organism 1 RARE FUNGUS ISOLATED-TO STATE LAB FOR  ID  Culture Comment -  Gram Stain 1 FAIR SPECIMEN-70-80% WBC  Gram Stain 2 MANY WHITE BLOOD CELLS  Gram Stain 3 MODERATE GRAM POSITIVE COCCI IN PAIRS  Gram Stain 4 FEW GRAM POSITIVE RODS RARE GRAM VARIABLE ROD  Routine Chem:  09-Jul-15 21:04   Glucose, Serum  120  BUN 8  Creatinine (comp) 1.24  Sodium, Serum 138  Potassium, Serum 3.9  Chloride, Serum 101  CO2, Serum 28  Calcium (Total), Serum 8.9  Osmolality (calc) 275  eGFR (African American)  57  eGFR (Non-African American)  49 (eGFR values <39m/min/1.73 m2 may be an indication of chronic kidney disease (CKD). Calculated eGFR is useful in patients with stable renal function. The eGFR calculation will not be reliable in acutely ill patients when serum creatinine is changing rapidly. It is not useful in  patients on dialysis. The eGFR calculation may not be applicable to patients at the low and high extremes of body sizes, pregnant women, and vegetarians.)  Anion Gap 9  B-Type Natriuretic Peptide ([facility]) 49 (Result(s) reported on 15 Apr 2014 at 10:06PM.)  15-Jul-15 05:12   Glucose, Serum  150  BUN  23  Creatinine (comp) 0.98  Sodium, Serum 137  Potassium, Serum 3.9  Chloride, Serum  96  CO2, Serum  37  Calcium (Total), Serum 8.7  Osmolality (calc) 280  eGFR (African  American) >60  eGFR (Non-African American) >60 (eGFR values <678mmin/1.73 m2 may be an indication of chronic kidney disease (CKD). Calculated eGFR is useful in patients with stable renal function. The eGFR calculation will not be reliable in acutely ill patients when serum creatinine is changing rapidly. It is not useful in  patients on dialysis. The eGFR calculation may not be applicable to patients at the low and high extremes of body sizes, pregnant women, and vegetarians.)  Anion Gap  4  Cardiac:  09-Jul-15 21:04   CPK-MB, Serum 0.7 (Result(s) reported on 16 Apr 2014 at 01:55AM.)  Troponin I < 0.02 (0.00-0.05 0.05 ng/mL or less: NEGATIVE  Repeat testing in 3-6 hrs  if clinically indicated. >0.05 ng/mL: POTENTIAL  MYOCARDIAL INJURY. Repeat  testing in 3-6 hrs if  clinically indicated. NOTE: An increase or decrease  of 30% or more on serial  testing suggests a  clinically important change)  Routine Hem:  09-Jul-15 21:04   Platelet Count (CBC) 191  WBC (CBC) 5.2  RBC (CBC)  3.73  Hemoglobin (CBC)  10.8  Hematocrit (CBC)  33.1  MCV 89  MCH 28.9  MCHC 32.5  RDW  15.5  Neutrophil % 60.9  Lymphocyte % 20.8  Monocyte % 8.8  Eosinophil % 8.3  Basophil % 1.2  Neutrophil # 3.2  Lymphocyte # 1.1  Monocyte # 0.5  Eosinophil # 0.4  Basophil # 0.1 (Result(s) reported on 15 Apr 2014 at 09:28PM.)  15-Jul-15 05:12   Platelet Count (CBC) 212 (Result(s) reported on 21 Apr 2014 at 06:21AM.)   PERTINENT RADIOLOGY STUDIES: XRay:    09-Jul-15 21:00, Chest 1 View AP or PA  Chest 1 View AP or PA   REASON FOR EXAM:    sob  COMMENTS:       PROCEDURE: DXR - DXR CHEST 1 VIEWAP OR PA  - Apr 15 2014  9:00PM     CLINICAL DATA:  Shortness of breath. Productive cough and fever for  2 days.    EXAM:  CHEST - 1 VIEW    COMPARISON:  03/16/2014    FINDINGS:  Normal heart  size and pulmonary vascularity. Coarse interstitial  changes throughout the lungs similar to prior study  consistent with  fibrosis. Probable chronic bronchitic changes with suggestion of  mild bronchiectasis as well. Since the previousstudy, there is  interval development of a focal nodular area of opacity in the left  mid lung measuring 2.7 cm. This could represent a small focal  infiltration. However, mass lesion should be excluded and CT is  recommended. No blunting of costophrenic angles. No pneumothorax.     IMPRESSION:  Chronic fibrosis and bronchitic changes in the lungs. Focal  developing nodular opacity in the left mid lung.      Electronically Signed    By: Lucienne Capers M.D.    On: 04/15/2014 21:03         Verified By: Neale Burly, M.D.,   Pertinent Past History:  Pertinent Past History COPD  Pulmonary fibrosis Hyperlipidemia Anxiety   Hospital Course:  Hospital Course 54 year old lady with h/o pulmonary fibrosis, admitted with COPD exacerbation and possible pneumonia, received i.v Solumedrol, Azithromycin and Aztreonam, Duoneb treatments and supplemental oxygen. Advair and Spiriva were continued. CT chest finding in 02/2014 suggestive of pulmonary fibrosis. Patient was evaluated by pulmonary. Wheezing and chest congestion improved during her stay. Blood c/s was negative at 5 days. Patient received Valium as needed fro Anxiety. Simvastatin was continued for hyperlipidemia. Exam: Alert, oriented x3, NAD, O2 n/c in use. Chest: scattered crackles, no rhonchi, good air entry bilaterally, CVS: RRR, Abdomen: benign, nontender, Ext: no edema.   Condition on Discharge Guarded   DISCHARGE INSTRUCTIONS HOME MEDS:  Medication Reconciliation: Patient's Home Medications at Discharge:     Medication Instructions  escitalopram 20 mg oral tablet  1 tab(s) orally once a day   simvastatin 40 mg oral tablet  1 tab(s) orally once a day (at bedtime)   promethazine 12.5 mg oral tablet   orally once a day, As Needed - for Nausea, Vomiting   advair diskus 500 mcg-50 mcg inhalation  powder  1 puff(s) inhaled 2 times a day   duoneb 0.5 mg-2.5 mg/3 ml inhalation solution  3 milliliter(s) inhaled 3 times a day, As Needed - for Shortness of Breath   ibuprofen 400 mg oral tablet  1 tab(s) orally once a day, As Needed   diazepam 5 mg oral tablet  1 tab(s) orally 2 times a day   ferrous sulfate 325 mg oral tablet  1 tab(s) orally once a day   spiriva 18 mcg inhalation capsule  1 cap(s) inhaled once a day   omeprazole 20 mg oral delayed release capsule  20 milligram(s) orally 2 times a day   albuterol 90 mcg/inh inhalation powder  2 puff(s) inhaled every 4 hours   azithromycin 500 mg oral tablet  1 tab(s) orally every 24 hours   azithromycin 500 mg oral tablet  1 tab(s) orally every 24 hours   prednisone 10 mg oral tablet  40 milligram(s) orally once a day for 3 days then 30 mg daily for 3 days then 20 mg daily for 3 days then 10 mg daily for 3 days    PRESCRIPTIONS: ELECTRONICALLY SUBMITTED   Physician's Instructions:  Diet Low Fat, Low Cholesterol   Activity Limitations As tolerated   Return to Work Not Applicable   Time frame for Follow Up Appointment 1-2 weeks  Dr. Raul Del   Time frame for Follow Up Appointment 1-2 weeks  Dr. Dwyane Luo, Gauri Galvao(Attending Physician): [facility], [phone]  [address] [place], [place] [address], [place]   Wallene Huh E(Consultant): [facility] Department of Pulmonology, [address] [place], [place] [address], [place] [phone]  Electronic Signatures: Glendon Axe (MD)  (Signed 16-Jul-15 14:27)  Authored: ADMISSION DATE AND DIAGNOSIS, CHIEF COMPLAINT/HPI, Allergies, PERTINENT LABS, PERTINENT RADIOLOGY STUDIES, PERTINENT PAST [facility], PATIENT INSTRUCTIONS, Follow Up Physician   Last Updated: 16-Jul-15 14:27 by Glendon Axe (MD)

## 2015-01-29 NOTE — H&P (Signed)
PATIENT NAME:  Kristen Cantrell, Kristen Cantrell MR#:  [id number] DATE OF BIRTH:  [birth year]  DATE OF ADMISSION:  02/15/2014  REFERRING PHYSICIAN:  Dr. Manson Passey  PRIMARY CARE PHYSICIAN: At [facility].  PULMONOLOGIST:  Dr. Meredeth Ide.   CHIEF COMPLAINT: Shortness of breath.   HISTORY OF PRESENT ILLNESS: A 54 year old Caucasian female with past medical history of COPD, 2 liters nasal cannula at baseline, biopsy-proven pulmonary fibrosis, presented with shortness of breath. He describes a 3 or 4 day duration of shortness of breath with dyspnea on exertion progressively worsening with associated cough, nonproductive, as well as subjective chills. Denies any fever. However, positive for weakness. Denies any recent sick contacts. Noted to be tachypneic on arrival, saturating in the low 90s despite 2 liters nasal cannula as well as her breathing treatments at home. Currently, she is complaining only of shortness of breath and generalized weakness and fatigue.   REVIEW OF SYSTEMS:  CONSTITUTIONAL: Positive for chills, fatigue, weakness. Denies fevers.  EYES: Denies blurred vision, double vision, or eye pain.  EARS, NOSE, THROAT: Denies tinnitus, ear pain, or hearing loss.  RESPIRATORY: Positive for cough and shortness of breath as described above.  CARDIOVASCULAR: Denies chest pain, palpitations, edema.  GASTROINTESTINAL: Denies nausea, vomiting, diarrhea, abdominal pain. GENITOURINARY: Denies dysuria or hematuria.  ENDOCRINE: Denies nocturia or thyroid problems.   HEMATOLOGY AND LYMPHATIC:  No signs of easy bruising or bleeding.  SKIN: Denies rash or lesions.  MUSCULOSKELETAL: Denies pain in neck, back, shoulders, knees, hips or arthritic symptoms.  NEUROLOGIC: Denies paralysis or paresthesias.  PSYCHIATRIC: Positive for depressive symptoms; however denies any homicidal or suicidal ideation.     PAST MEDICAL HISTORY: COPD, pulmonary fibrosis on 2 liters nasal cannula at baseline, hyperlipidemia, gastro-reflux  disease, and fibromyalgia.   SOCIAL HISTORY: The patient continues to smoke on a daily basis. Denies any alcohol usage.   FAMILY HISTORY: Positive for lung disease in her father, what appeared to be silicosis asbestosis what appears to be asbestosis.   ALLERGIES: CODEINE AND DOXYCYCLINE AND LEVAQUIN, NIACIN, PENICILLIN AND SULFA DRUGS.   HOME MEDICATIONS: Include acetaminophen hydrocodone 325/5 mg p.o. q. 8 hours as needed for pain, diazepam 5 mg p.o. at bedtime half a tablet every eight hours as needed for vertigo, Lexapro 20 mg p.o. daily, promethazine 12.5 mg p.o. daily as needed for nausea and vomiting, simvastatin 40 mg p.o. at bedtime, Advair 500/50 mcg 1 puff b.i.d. DuoNeb treatments 3 mL every 4 hours as needed for shortness of breath, ProAir 90 mcg inhalation 2 puffs 4 times a day as needed for shortness of breath, Spiriva 18 mcg inhalation daily, Feosol 325 mg p.o. b.i.d., Prilosec 20 mg p.o. b.i.d.   PHYSICAL EXAMINATION:  VITAL SIGNS: Heart rate 82, respirations 36,on arrival now  20, blood pressure 137/80, saturating 98% on supplemental O2, 4 liters nasal cannula. Weight 76.2 kg, BMI 28.9.  GENERAL: Weak, frail-appearing, Caucasian female, currently in minimal distress secondary to respiratory status.  HEAD: Normocephalic, atraumatic.  EYES: Pupils equal, round, and reactive to light. Extraocular muscles intact. No scleral icterus.  MOUTH: Moist mucosal membrane. Dentition intact. No abscess noted.  EARS, NOSE, AND THROAT:  Clear without exudates. No external lesions.  NECK: Supple. No thyromegaly. No nodules. No JVD.  PULMONARY: Scattered fine crackles.  Poor respiratory effort. No use of accessory muscles.  CHEST: Nontender to palpation.  CARDIOVASCULAR: S1, S2, regular rate and rhythm. No murmurs, rubs, or gallops.  No edema. Pedal pulses 2+ bilaterally.  GASTROINTESTINAL: Soft, nontender, nondistended, no masses.  Positive bowel sounds. No hepatosplenomegaly.  MUSCULOSKELETAL:  No swelling, clubbing, or edema. Range of motion full in all extremities.  NEUROLOGIC: Cranial nerves II through XII intact. No gross focal neurological deficits. Sensation intact. Reflexes intact.  SKIN: No ulcerations, lesions, rashes or cyanosis. Skin warm and dry. Turgor intact.  PSYCH:  Mood and affect, anxious and tearful. Awake, alert, oriented x 3. Insight and judgment intact.   LABORATORY DATA: Sodium 137, potassium 3.7, chloride 102, bicarbonate 28, BUN 7, creatinine 0.92, glucose 106.  LFTs within normal limits. Troponin I less than 0.02, CK of 209. WBC 7.5, hemoglobin 11.8, platelets of 142,000. ABG performed is 7.4, pO2 of 43, O2 of 112, bicarbonate of 26.6, FiO2 of 36.   RADIOLOGICAL DATA: Chest x-ray performed, no acute changes from prior examination. There are some patchy interstitial infiltrates. No focal findings. CT chest performed on May 8th revealing nonspecific subpleural distribution of patchy ventricular airspace opacities representing the biopsy-proven diagnosis of pulmonary fibrosis.   ASSESSMENT AND PLAN: A 54 year old Caucasian female with history of chronic obstructive pulmonary disease and pulmonary fibrosis, 2 liters nasal cannula baseline O2 requirement is presenting with shortness of breath.  1. Chronic obstructive pulmonary disease exacerbation: DuoNeb therapy q. 4 hours. Supplemental oxygen to keep oxygen saturation greater than 90%. Solu-Medrol, incentive spirometry, azithromycin. Will consult pulmonology, Dr. Meredeth IdeFleming, as the patient is known to him and has a general concern if she does not improve with traditional chronic obstructive pulmonary disease treatment, this could be a flare of pulmonary fibrosis. We will continue with her home dosages of Advair and Spiriva.  2. Depression. Continue with Lexapro.  3. Gastroesophageal reflux disease. Continue proton pump inhibitor therapy. 4. Hyperlipidemia.  Continue statin therapy.  5. Deep venous thrombosis prophylaxis  with heparin subcutaneously.  TIME SPENT: 40 minutes.    ____________________________ Cletis Athensavid K. Raney Antwine, MD dkh:dd D: 02/15/2014 02:09:55 ET T: 02/15/2014 05:39:31 ET JOB#: [id number]  cc: Cletis Athensavid K. Jonay Hitchcock, MD, <Dictator> Morley Gaumer Synetta ShadowK Rayyan Burley MD ELECTRONICALLY SIGNED 02/15/2014 20:38

## 2015-01-29 NOTE — Discharge Summary (Signed)
PATIENT NAME:  Kristen Cantrell, Kristen Cantrell MR#:  [id number] DATE OF BIRTH:  1961/02/04  DATE OF ADMISSION:  02/15/2014 DATE OF DISCHARGE:  02/16/2014  REASON FOR ADMISSION: Shortness of breath.   DISCHARGE DIAGNOSES: 1.  Chronic obstructive pulmonary disease exacerbation/pulmonary fibrosis.  2.  Anxiety and depression.  3.  Fibromyalgia.  4.  Hyperlipidemia.  5.  Gastroesophageal reflux disease. 6.  Chronic respiratory failure, on 2 liters of oxygen at home.   DISPOSITION: Home.   DISCHARGE FOLLOWUP:  1.  [facility] in the next 1 to 2 weeks.  2.  Dr. Meredeth Ide in 1 to 2 weeks.   MEDICATIONS AT DISCHARGE: 1.  Diazepam 5 mg once at bedtime or 1/2 tablet every 8 hours as needed for vertigo or anxiety.  2.  Omeprazole 20 mg twice daily. 3.  Lexapro 20 mg once daily. 4.  Simvastatin 40 mg once daily. 5.  ProAir HFA 90 mcg 2 puffs 4 times a day as needed for shortness of breath. 6.  Spiriva 18 mcg once daily. 7.  Ferrous sulfate 325 mg twice a day. 8.  Promethazine 12.5 mg once or twice a day as needed for nausea. 9.  Advair Diskus 500/50 mcg twice daily. 10.  Norco 325/5 mg every 8 hours as needed for pain and also cough.  11.  DuoNebs 3 mL 4 times a day as needed for shortness of breath. 12.  Prednisone 10 mg tablets start at 6 tablets once a day decrease 1 tablet a day until gone. Should last 6 days. 13.  Azithromycin 500 mg once a day for 5 days.   DIAGNOSTIC DATA: Important results: Glucose 106, creatinine 0.92, sodium 137, potassium 3.4. Troponin 0.02. White count 7.5, platelet count 142. Urinalysis negative for urinary tract infection.  ABG has a pH of 7.4, pO2 112, pCO2 43, and HCO3 of 26.   EKG: Normal sinus rhythm. No ST depression or elevation.   Chest x-ray: Patchy interstitial changes secondary to pulmonary fibrosis without any significant  changes from comparison.  HOSPITAL COURSE: Kristen Cantrell is a 54 year old female with history of pulmonary fibrosis,  fibromyalgia,  depression, and anxiety who comes with a history of shortness of breath. The patient is going through a lot of problems at home with her sons moving into the house, bringing a lot of friends, smoking tobacco and marijuana around her. The patient states that this has brought up a lot of respiratory problems and lot of anxiety problems. She comes with history of shortness of breath for 3 to 4 days prior to admission, progressively getting worse, worse with exertion, associated with significant cough which has not been productive sputum. The patient had an oxygen saturation of low 90s on 2 liters nasal cannula. She was admitted with steroids and antibiotics for acute bronchitis. The patient had a course of azithromycin and Solu-Medrol IV and had significant improvement. By discharge the patient is on 2 liters nasal cannula with oxygen saturations above 92%, which is her baseline.   As far as her pulmonary fibrosis, the patient requires follow-up with Dr. Meredeth Ide in the next 1 to 2 weeks.  For depression, continue Lexapro.  For GERD, continue PPI.  I had long conversations with the patient about her family dynamics. The patient is considering moving into an independent living facility. Otherwise she is stable for discharge today.   TIME SPENT: About 40 minutes discharging this patient today. ____________________________ Kristen Furnace, MD rsg:sb D: 02/16/2014 13:41:42 ET T: 02/16/2014 14:03:38 ET JOB#: [id number]  cc: Kristen Furnaceoberto Sanchez Gutierrez, MD, <Dictator> Jaala Bohle Juanda ChanceSANCHEZ GUTIERRE MD ELECTRONICALLY SIGNED 02/26/2014 22:13

## 2015-05-09 DEATH — deceased

## 2016-02-14 IMAGING — CT CT CHEST W/O CM
2 of 3 series · 15 of 36 positions shown, 18 images · non-contrast
Comparison: Chest radiograph 12/22/2013, chest CT 08/28/2013

CLINICAL DATA: Newly diagnosed pulmonary fibrosis

EXAM:
CT CHEST WITHOUT CONTRAST
TECHNIQUE: Multidetector CT imaging of the chest was performed following the
standard protocol without IV contrast..

[Series 2: routine chest wo · axial · 0.66mm/px · z∈[-502,-247]mm · 12 of 61 slices shown, 15 images]
[im 5/61  mediastinal]
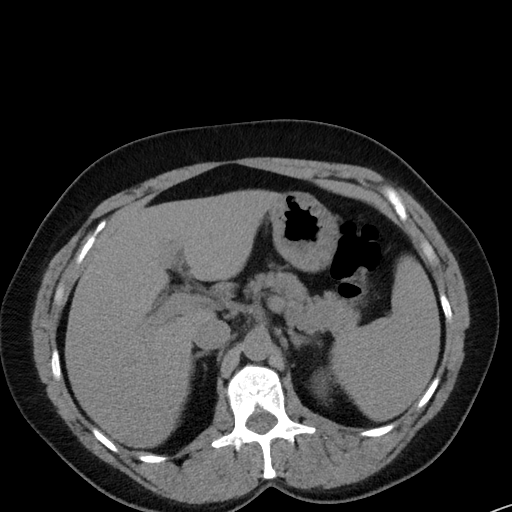
[im 5/61  lung]
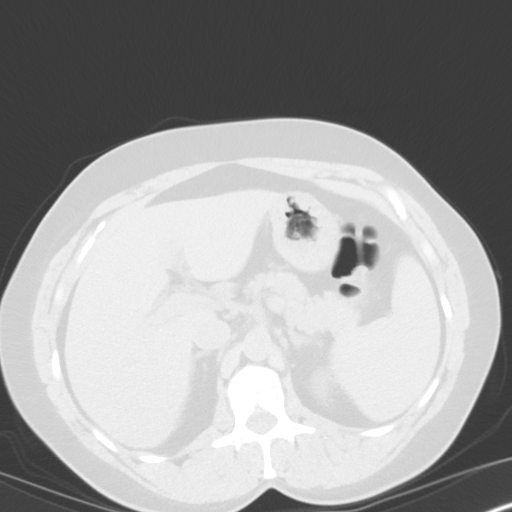
[im 9/61  lung]
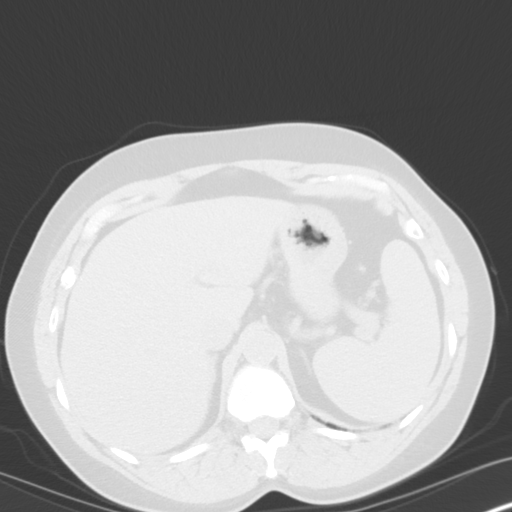
[im 14/61  lung]
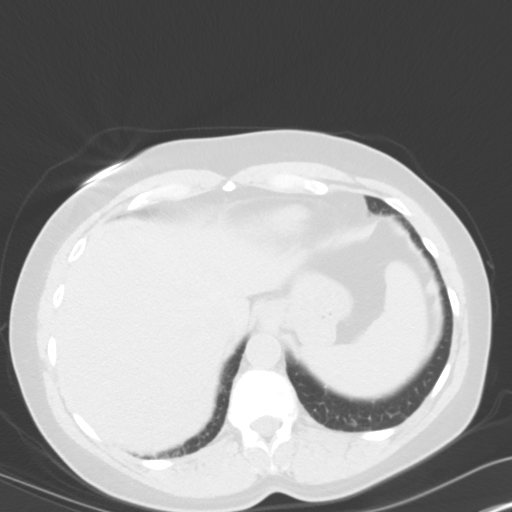
[im 18/61  lung]
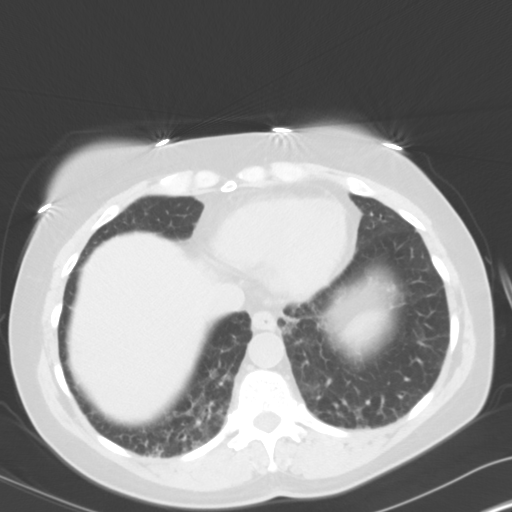
[im 23/61  mediastinal]
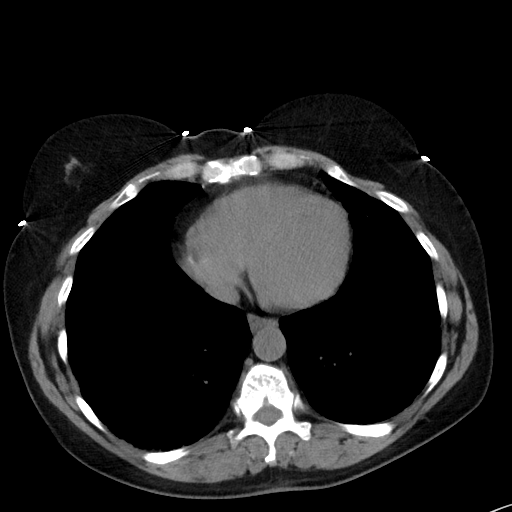
[im 23/61  lung]
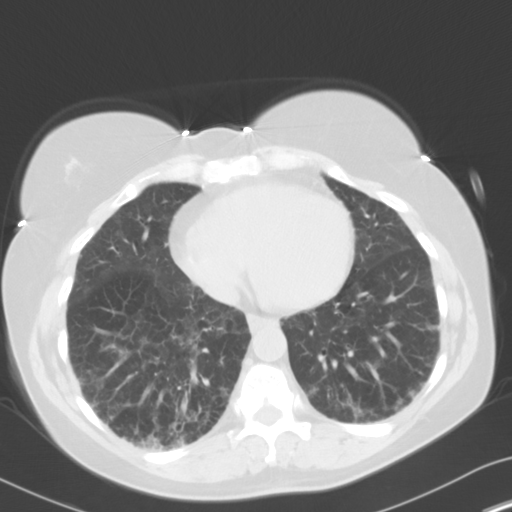
[im 27/61  lung]
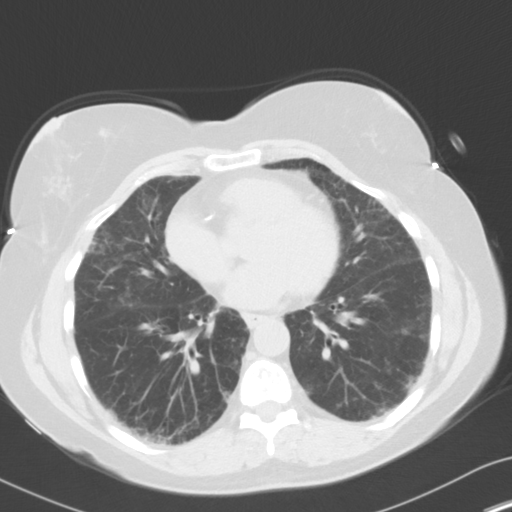
[im 34/61  lung]
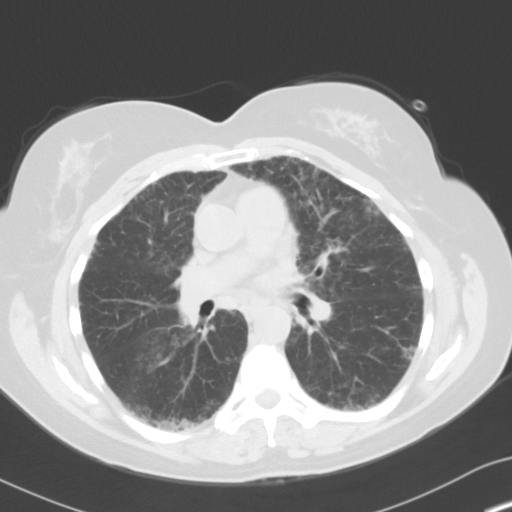
[im 38/61  lung]
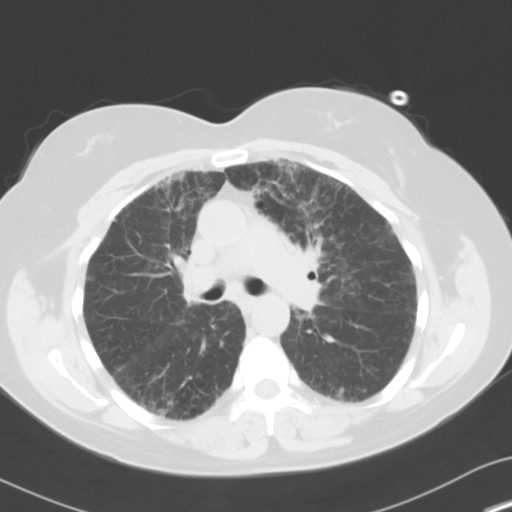
[im 43/61  mediastinal]
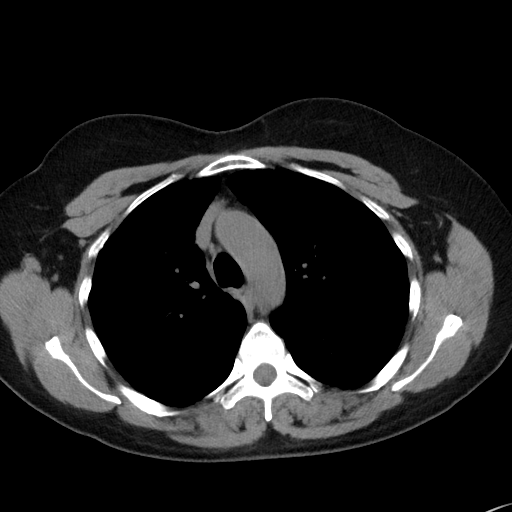
[im 43/61  lung]
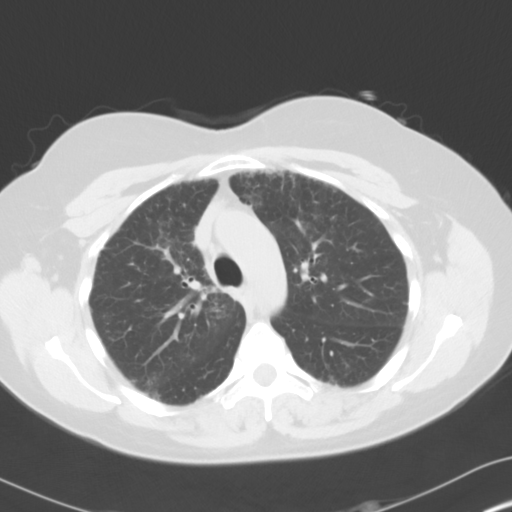
[im 47/61  lung]
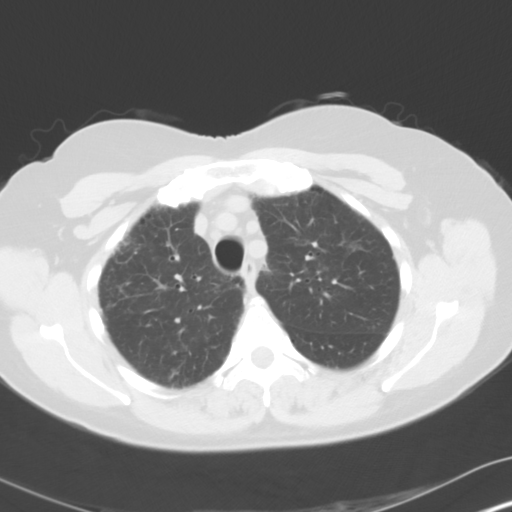
[im 52/61  lung]
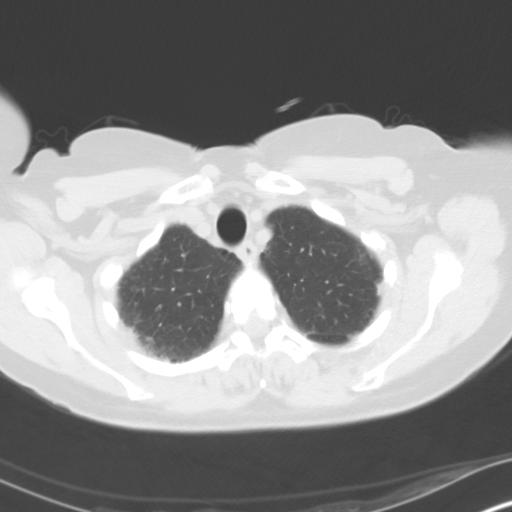
[im 56/61  lung]
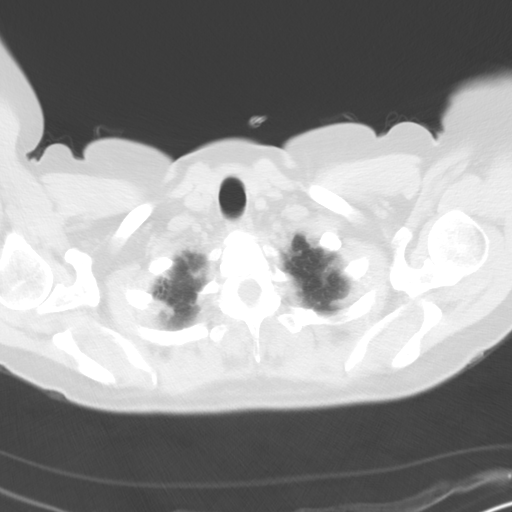

[Series 5: cor routine chest wo · coronal · 0.60mm/px · 3 of 151 slices shown]
[im 31/151  lung]
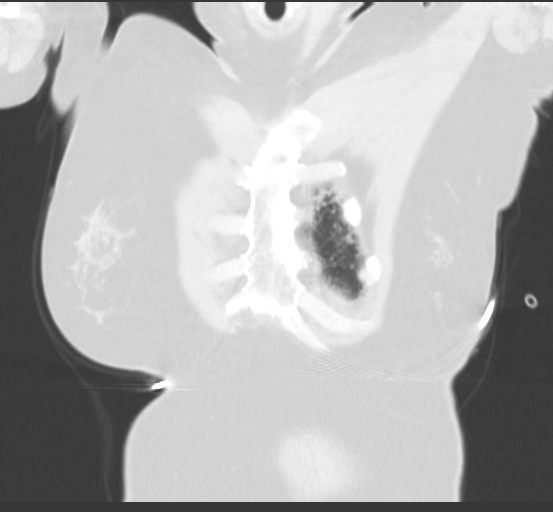
[im 61/151  lung]
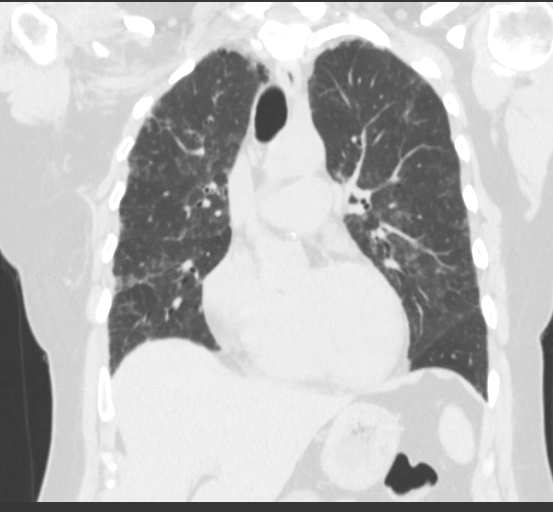
[im 91/151  lung]
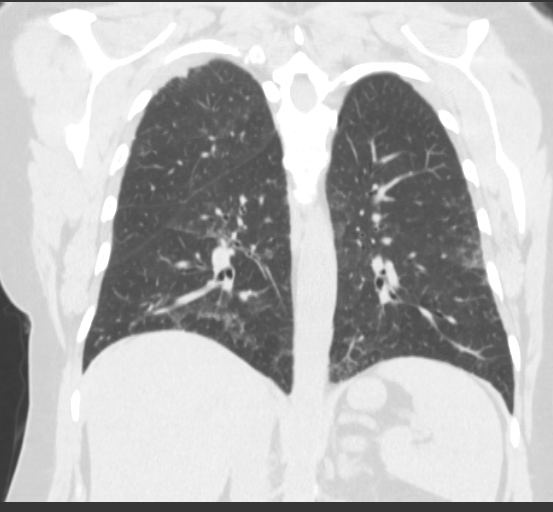

[15 of 36 positions shown; findings below may reference images not displayed]

FINDINGS: Please note the current examination was performed per physician
order as a standard chest CT without contrast, which does not
include high-resolution imaging as may be performed for the above
listed clinical indication of interstitial lung disease.

Great vessels are normal in caliber. Small mediastinal nodes are
identified, largest pretracheal node 6 mm short axis diameter image
21. Heart size is normal. No pericardial or pleural effusion.
Incomplete imaging of the upper abdomen demonstrates
normal-appearing adrenal glands.

Areas of subpleural reticular opacity are identified. Minimal
bibasilar atelectasis is present. No pleural effusion. No lobar
consolidation or dominant nodule or mass. Central airways are
patent.

No acute osseous abnormality.
IMPRESSION: Nonspecific subpleural distribution of patchy reticular airspace
opacities. If the patient has a biopsy-proven diagnosis of pulmonary
fibrosis, this imaging appearance may account for the reported
clinical history. However, other etiologies may also account for
this appearance which can be seen with usual interstitial
pneumonitis (of which idiopathic pulmonary fibrosis is one cause).
If future surveillance is needed, consider chest CT without
contrast, specifically with high resolution protocol for further
evaluation.

## 2016-03-09 IMAGING — CR DG CHEST 1V PORT
1 series · 1 of 1 positions shown · non-contrast
Comparison: 03/04/2014

CLINICAL DATA: Dyspnea

EXAM:
PORTABLE CHEST - 1 VIEW

[ap]
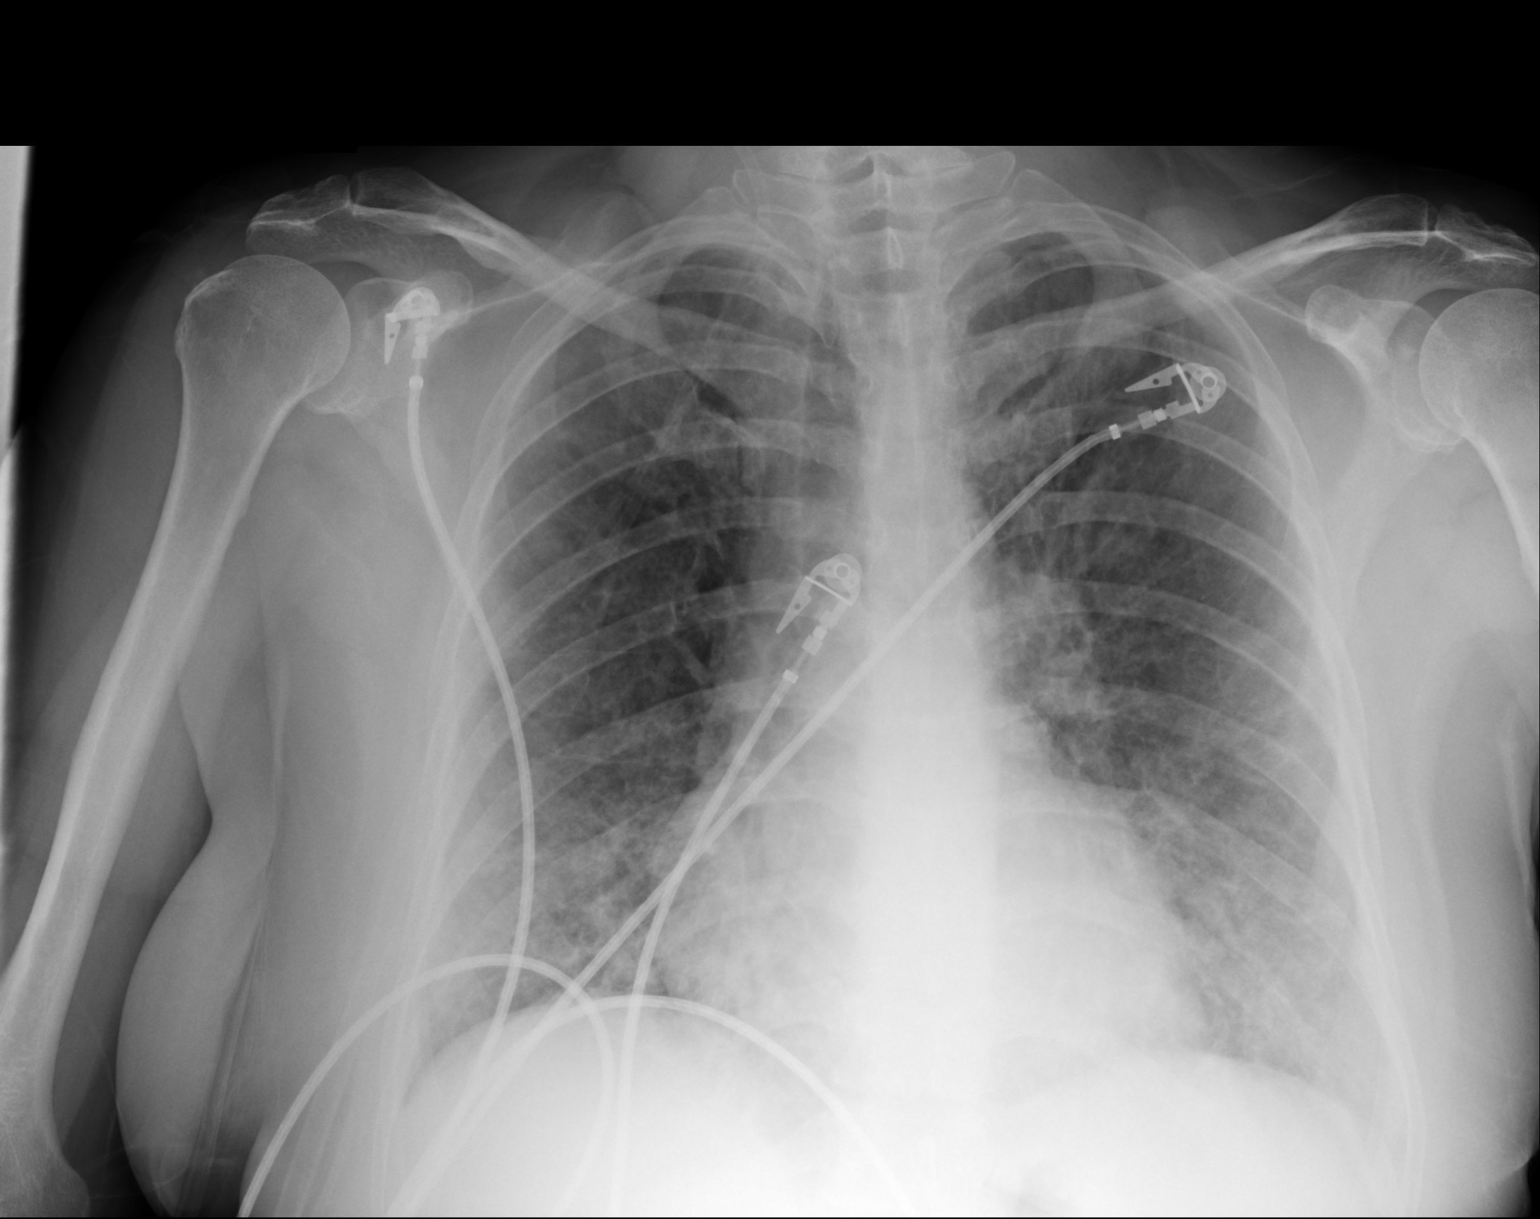

[1 of 1 positions shown; findings below may reference images not displayed]

FINDINGS: Cardiac shadow is stable. The lungs are well aerated bilaterally.
Diffuse interstitial changes are again identified. Increased density
is noted in the right lung base medially which may represent a
superimposed infiltrate. Two-view chest may be helpful for further
evaluation.
IMPRESSION: Chronic changes with increased density in the right medial lung base
likely representing an acute on chronic infiltrate.

## 2016-03-17 IMAGING — CR DG CHEST 2V
1 series · 2 of 2 positions shown · non-contrast
Comparison: Portable chest x-ray March 08, 2014 and December 22, 2013.

CLINICAL DATA: Cough and fever with history of COPD and pulmonary
fibrosis

EXAM:
CHEST  2 VIEW

[Series 1: pa · 0.17mm/px · 2 of 2 slices shown]
[im 1/2]
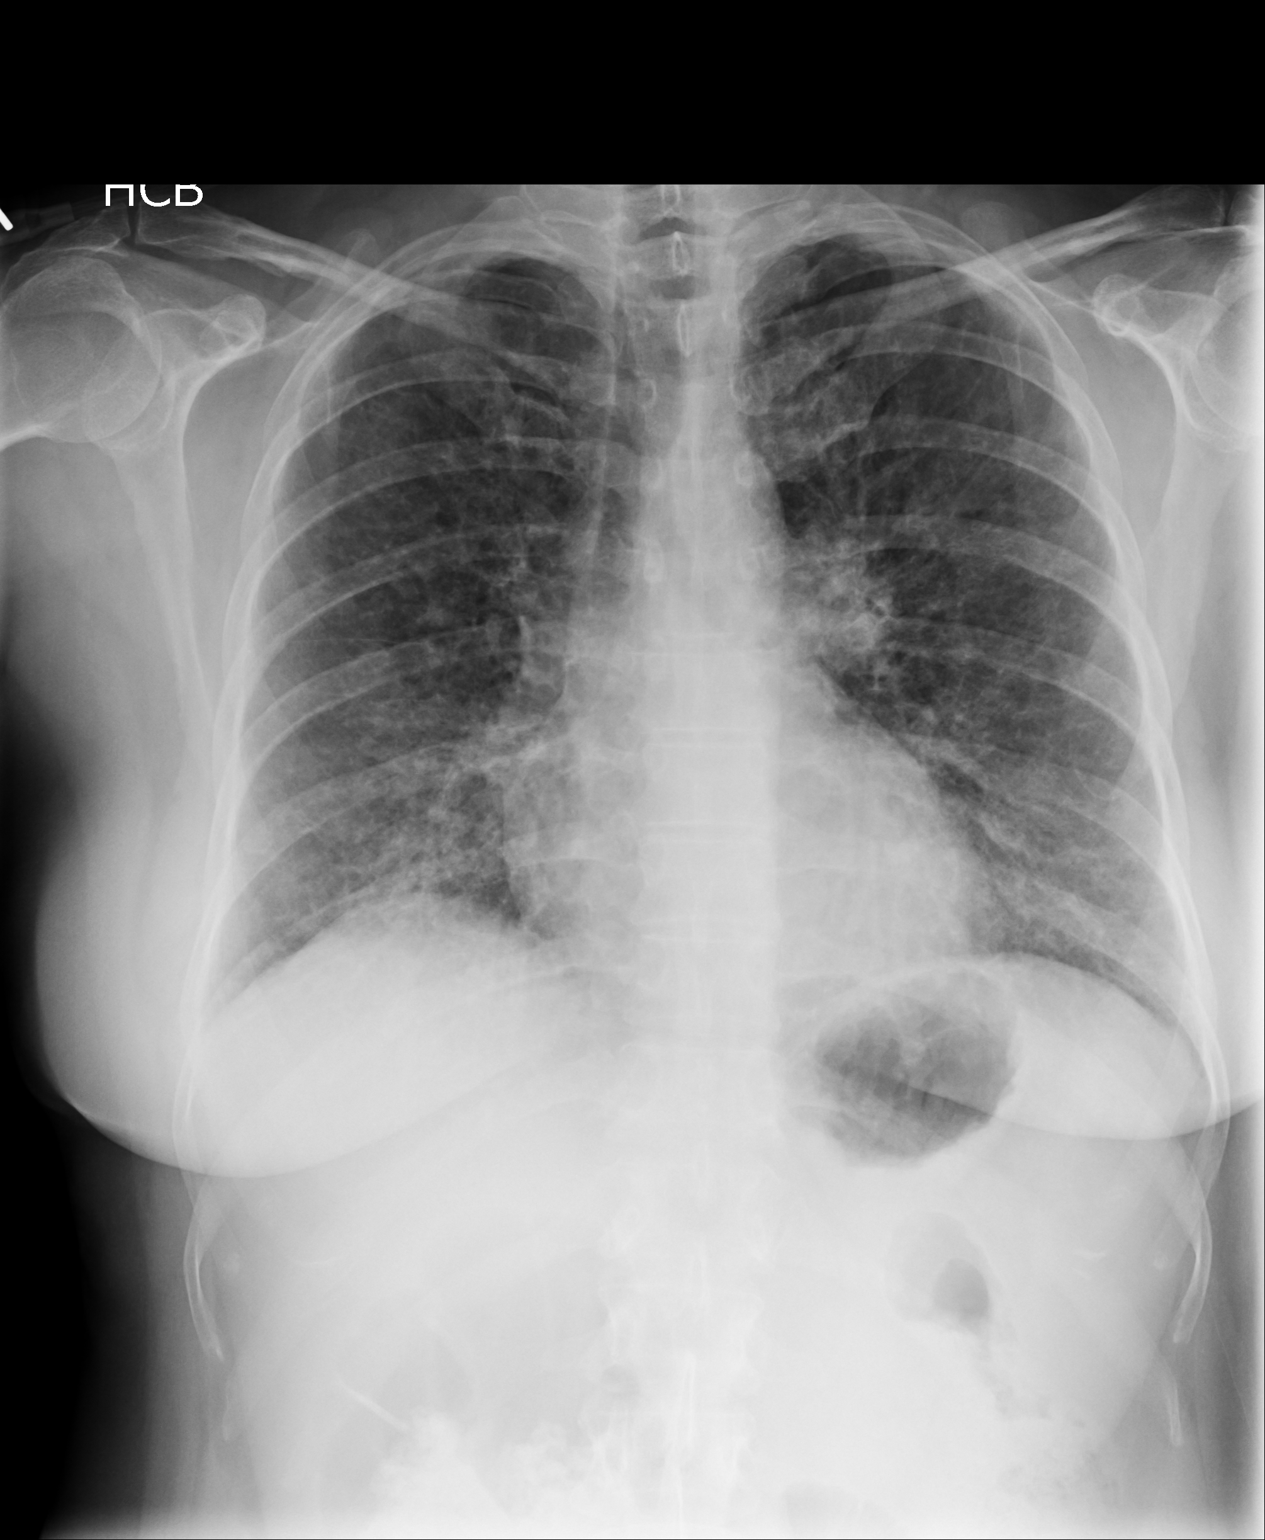
[im 2/2]
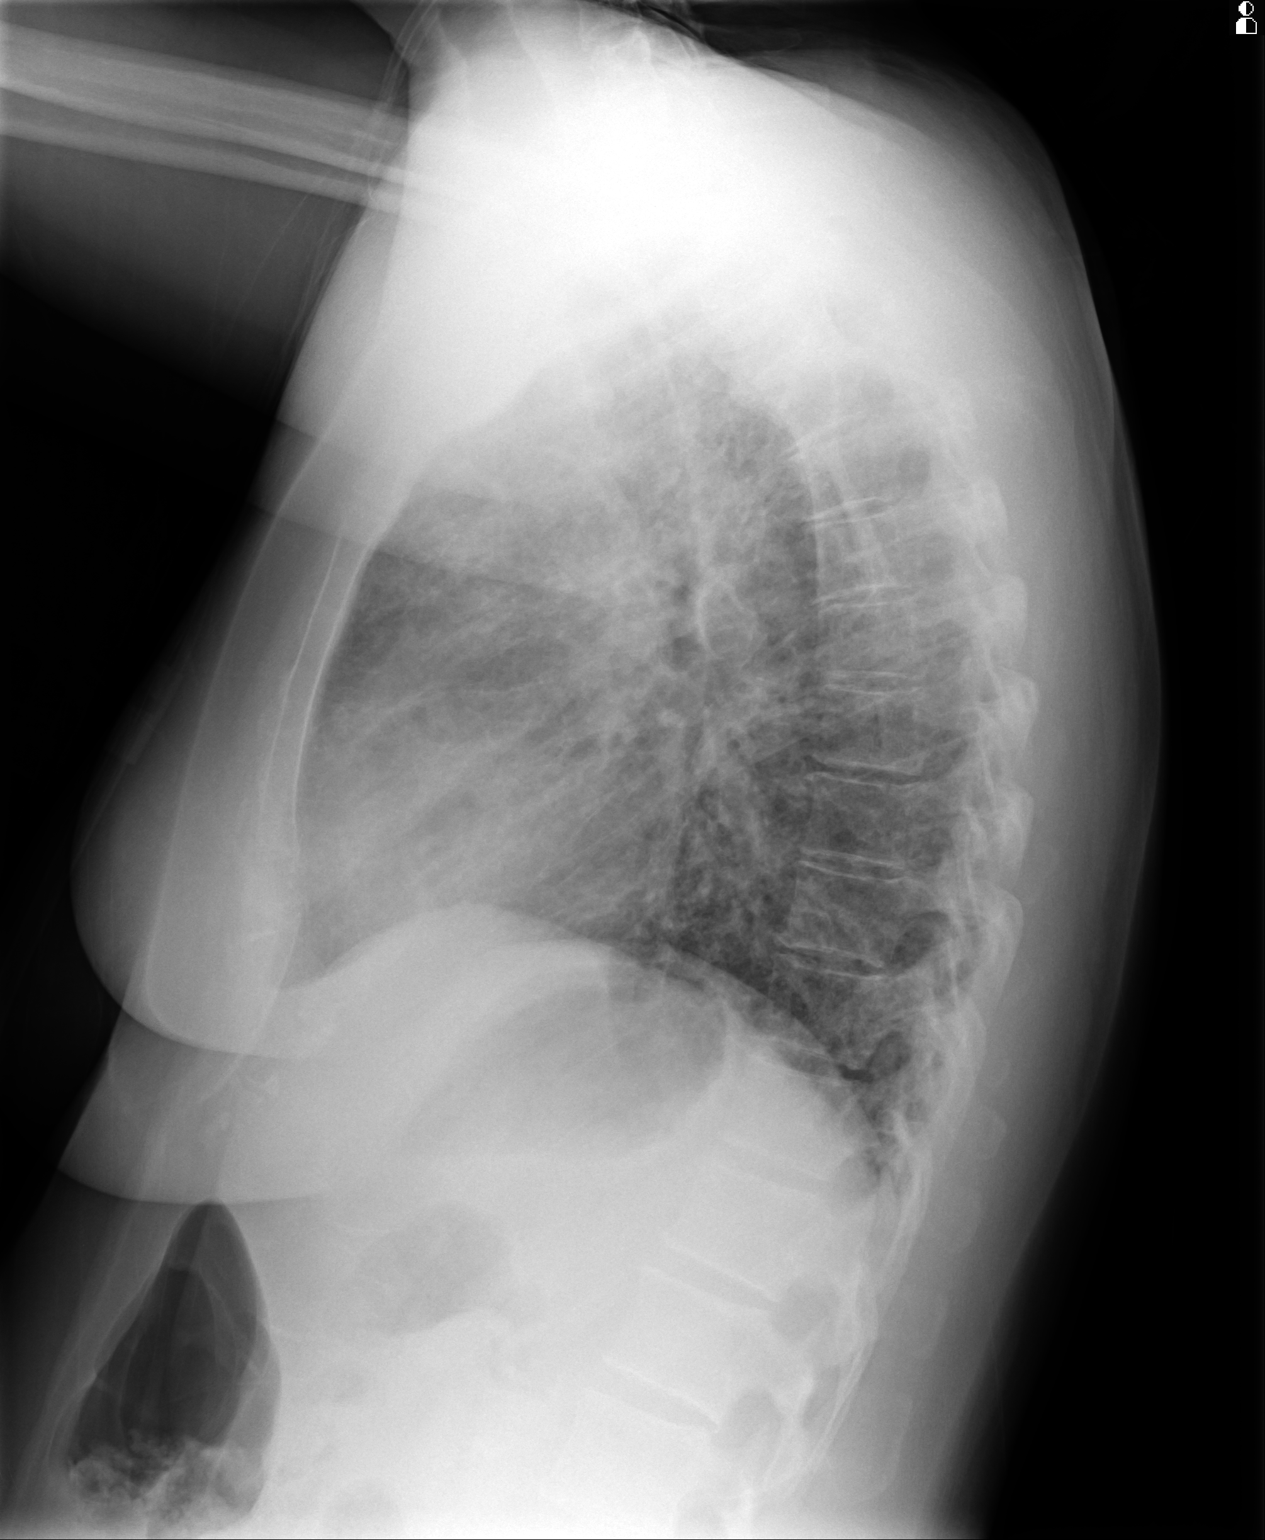

[2 of 2 positions shown; findings below may reference images not displayed]

FINDINGS: The lungs are adequately inflated. The interstitial markings are
more prominent today than on the study 08 March, 2014. There is no
alveolar infiltrate. The cardiac silhouette is normal in size.
Stable left hilar prominence is present. There is no pleural
effusion. The bony thorax is unremarkable.
IMPRESSION: There has been further increased in the conspicuity of the
interstitial markings. This may reflect interstitial pneumonia
superimposed upon known pulmonary fibrosis. When compared to the
study December 22, 2013 there has been dramatic interval change in
the conspicuity of the interstitial markings.
